# Patient Record
Sex: Male | Born: 1960 | Race: Black or African American | Hispanic: No | Marital: Married | Smoking: Never smoker
Health system: Southern US, Community
[De-identification: ages and names within clinical notes are randomized; demographics above are authoritative.]

## PROBLEM LIST (undated history)

## (undated) DIAGNOSIS — R079 Chest pain, unspecified: Secondary | ICD-10-CM

## (undated) DIAGNOSIS — I10 Essential (primary) hypertension: Secondary | ICD-10-CM

## (undated) DIAGNOSIS — E119 Type 2 diabetes mellitus without complications: Secondary | ICD-10-CM

## (undated) HISTORY — DX: Chest pain, unspecified: R07.9

## (undated) HISTORY — PX: HAND SURGERY: SHX662

## (undated) HISTORY — DX: Essential (primary) hypertension: I10

## (undated) HISTORY — DX: Type 2 diabetes mellitus without complications: E11.9

## (undated) HISTORY — PX: COLONOSCOPY: SHX174

---

## 1999-05-19 ENCOUNTER — Encounter: Payer: Self-pay | Admitting: Emergency Medicine

## 1999-05-19 ENCOUNTER — Emergency Department (HOSPITAL_COMMUNITY): Admission: EM | Admit: 1999-05-19 | Discharge: 1999-05-19 | Payer: Self-pay | Admitting: *Deleted

## 1999-05-27 ENCOUNTER — Encounter: Payer: Self-pay | Admitting: Family Medicine

## 1999-05-27 ENCOUNTER — Ambulatory Visit (HOSPITAL_COMMUNITY): Admission: RE | Admit: 1999-05-27 | Discharge: 1999-05-27 | Payer: Self-pay | Admitting: Family Medicine

## 2001-02-28 ENCOUNTER — Emergency Department (HOSPITAL_COMMUNITY): Admission: EM | Admit: 2001-02-28 | Discharge: 2001-02-28 | Payer: Self-pay | Admitting: Emergency Medicine

## 2001-02-28 ENCOUNTER — Encounter: Payer: Self-pay | Admitting: Emergency Medicine

## 2008-10-05 ENCOUNTER — Ambulatory Visit: Payer: Self-pay | Admitting: Family Medicine

## 2008-10-05 DIAGNOSIS — J309 Allergic rhinitis, unspecified: Secondary | ICD-10-CM | POA: Insufficient documentation

## 2008-10-05 DIAGNOSIS — G43909 Migraine, unspecified, not intractable, without status migrainosus: Secondary | ICD-10-CM | POA: Insufficient documentation

## 2008-10-11 ENCOUNTER — Ambulatory Visit: Payer: Self-pay | Admitting: Cardiology

## 2008-10-11 ENCOUNTER — Telehealth (INDEPENDENT_AMBULATORY_CARE_PROVIDER_SITE_OTHER): Payer: Self-pay | Admitting: *Deleted

## 2008-11-02 ENCOUNTER — Ambulatory Visit: Payer: Self-pay | Admitting: Family Medicine

## 2008-11-03 ENCOUNTER — Encounter (INDEPENDENT_AMBULATORY_CARE_PROVIDER_SITE_OTHER): Payer: Self-pay | Admitting: *Deleted

## 2008-11-03 LAB — CONVERTED CEMR LAB
ALT: 40 units/L (ref 0–53)
AST: 24 units/L (ref 0–37)
Albumin: 4.2 g/dL (ref 3.5–5.2)
Alkaline Phosphatase: 66 units/L (ref 39–117)
BUN: 15 mg/dL (ref 6–23)
Basophils Absolute: 0.1 10*3/uL (ref 0.0–0.1)
Basophils Relative: 0.8 % (ref 0.0–3.0)
Bilirubin, Direct: 0 mg/dL (ref 0.0–0.3)
CO2: 28 meq/L (ref 19–32)
Calcium: 9.3 mg/dL (ref 8.4–10.5)
Chloride: 107 meq/L (ref 96–112)
Cholesterol: 143 mg/dL (ref 0–200)
Creatinine, Ser: 1.1 mg/dL (ref 0.4–1.5)
Eosinophils Absolute: 0.2 10*3/uL (ref 0.0–0.7)
Eosinophils Relative: 2.1 % (ref 0.0–5.0)
GFR calc non Af Amer: 92.87 mL/min (ref >60)
Glucose, Bld: 99 mg/dL (ref 70–99)
HCT: 47.6 % (ref 39.0–52.0)
HDL: 61 mg/dL (ref >39.00)
Hemoglobin: 16.1 g/dL (ref 13.0–17.0)
LDL Cholesterol: 56 mg/dL (ref 0–99)
Lymphocytes Relative: 33.3 % (ref 12.0–46.0)
Lymphs Abs: 2.5 10*3/uL (ref 0.7–4.0)
MCHC: 33.8 g/dL (ref 30.0–36.0)
MCV: 81.1 fL (ref 78.0–100.0)
Monocytes Absolute: 0.7 10*3/uL (ref 0.1–1.0)
Monocytes Relative: 9.7 % (ref 3.0–12.0)
Neutro Abs: 3.9 10*3/uL (ref 1.4–7.7)
Neutrophils Relative %: 54.1 % (ref 43.0–77.0)
Platelets: 231 10*3/uL (ref 150.0–400.0)
Potassium: 4 meq/L (ref 3.5–5.1)
RBC: 5.86 M/uL — ABNORMAL HIGH (ref 4.22–5.81)
RDW: 13.1 % (ref 11.5–14.6)
Sodium: 141 meq/L (ref 135–145)
TSH: 1.31 microintl units/mL (ref 0.35–5.50)
Total Bilirubin: 1.3 mg/dL — ABNORMAL HIGH (ref 0.3–1.2)
Total CHOL/HDL Ratio: 2
Total Protein: 7.4 g/dL (ref 6.0–8.3)
Triglycerides: 132 mg/dL (ref 0.0–149.0)
VLDL: 26.4 mg/dL (ref 0.0–40.0)
WBC: 7.4 10*3/uL (ref 4.5–10.5)

## 2009-08-10 ENCOUNTER — Encounter: Admission: RE | Admit: 2009-08-10 | Discharge: 2009-08-10 | Payer: Self-pay | Admitting: *Deleted

## 2009-09-17 ENCOUNTER — Encounter (INDEPENDENT_AMBULATORY_CARE_PROVIDER_SITE_OTHER): Payer: Self-pay | Admitting: *Deleted

## 2009-09-17 ENCOUNTER — Ambulatory Visit: Payer: Self-pay | Admitting: Internal Medicine

## 2010-04-03 ENCOUNTER — Telehealth: Payer: Self-pay | Admitting: Family Medicine

## 2010-04-12 ENCOUNTER — Ambulatory Visit: Payer: Self-pay | Admitting: Family Medicine

## 2010-04-12 DIAGNOSIS — I10 Essential (primary) hypertension: Secondary | ICD-10-CM | POA: Insufficient documentation

## 2010-04-12 LAB — CONVERTED CEMR LAB
ALT: 28 units/L (ref 0–53)
AST: 19 units/L (ref 0–37)
Albumin: 4.4 g/dL (ref 3.5–5.2)
Alkaline Phosphatase: 82 units/L (ref 39–117)
BUN: 14 mg/dL (ref 6–23)
Basophils Absolute: 0 10*3/uL (ref 0.0–0.1)
Basophils Relative: 0.4 % (ref 0.0–3.0)
Bilirubin, Direct: 0.3 mg/dL (ref 0.0–0.3)
CO2: 30 meq/L (ref 19–32)
Calcium: 9.2 mg/dL (ref 8.4–10.5)
Chloride: 103 meq/L (ref 96–112)
Cholesterol: 156 mg/dL (ref 0–200)
Creatinine, Ser: 1 mg/dL (ref 0.4–1.5)
Eosinophils Absolute: 0.2 10*3/uL (ref 0.0–0.7)
Eosinophils Relative: 1.7 % (ref 0.0–5.0)
GFR calc non Af Amer: 100.68 mL/min (ref >60)
Glucose, Bld: 92 mg/dL (ref 70–99)
HCT: 46.5 % (ref 39.0–52.0)
HDL: 70.8 mg/dL (ref >39.00)
Hemoglobin: 15.6 g/dL (ref 13.0–17.0)
LDL Cholesterol: 55 mg/dL (ref 0–99)
Lymphocytes Relative: 29.6 % (ref 12.0–46.0)
Lymphs Abs: 2.6 10*3/uL (ref 0.7–4.0)
MCHC: 33.6 g/dL (ref 30.0–36.0)
MCV: 80.3 fL (ref 78.0–100.0)
Monocytes Absolute: 0.8 10*3/uL (ref 0.1–1.0)
Monocytes Relative: 8.5 % (ref 3.0–12.0)
Neutro Abs: 5.4 10*3/uL (ref 1.4–7.7)
Neutrophils Relative %: 59.8 % (ref 43.0–77.0)
PSA: 0.42 ng/mL (ref 0.10–4.00)
Platelets: 260 10*3/uL (ref 150.0–400.0)
Potassium: 3.9 meq/L (ref 3.5–5.1)
RBC: 5.79 M/uL (ref 4.22–5.81)
RDW: 13.8 % (ref 11.5–14.6)
Sodium: 140 meq/L (ref 135–145)
TSH: 1.43 microintl units/mL (ref 0.35–5.50)
Total Bilirubin: 1.8 mg/dL — ABNORMAL HIGH (ref 0.3–1.2)
Total CHOL/HDL Ratio: 2
Total Protein: 7.6 g/dL (ref 6.0–8.3)
Triglycerides: 152 mg/dL — ABNORMAL HIGH (ref 0.0–149.0)
VLDL: 30.4 mg/dL (ref 0.0–40.0)
WBC: 9 10*3/uL (ref 4.5–10.5)

## 2010-05-15 ENCOUNTER — Encounter (INDEPENDENT_AMBULATORY_CARE_PROVIDER_SITE_OTHER): Payer: Self-pay | Admitting: *Deleted

## 2010-07-22 ENCOUNTER — Ambulatory Visit
Admission: RE | Admit: 2010-07-22 | Discharge: 2010-07-22 | Payer: Self-pay | Source: Home / Self Care | Attending: Family Medicine | Admitting: Family Medicine

## 2010-07-22 ENCOUNTER — Encounter: Payer: Self-pay | Admitting: Family Medicine

## 2010-07-22 ENCOUNTER — Other Ambulatory Visit: Payer: Self-pay | Admitting: Family Medicine

## 2010-07-22 LAB — HEPATIC FUNCTION PANEL
ALT: 27 U/L (ref 0–53)
AST: 20 U/L (ref 0–37)
Albumin: 4.2 g/dL (ref 3.5–5.2)
Alkaline Phosphatase: 71 U/L (ref 39–117)
Bilirubin, Direct: 0.2 mg/dL (ref 0.0–0.3)
Total Bilirubin: 1.3 mg/dL — ABNORMAL HIGH (ref 0.3–1.2)
Total Protein: 7.4 g/dL (ref 6.0–8.3)

## 2010-07-22 LAB — LIPID PANEL
Cholesterol: 153 mg/dL (ref 0–200)
HDL: 70.9 mg/dL (ref >39.00)
LDL Cholesterol: 62 mg/dL (ref 0–99)
Total CHOL/HDL Ratio: 2
Triglycerides: 103 mg/dL (ref 0.0–149.0)
VLDL: 20.6 mg/dL (ref 0.0–40.0)

## 2010-07-22 LAB — BASIC METABOLIC PANEL
BUN: 18 mg/dL (ref 6–23)
CO2: 28 mEq/L (ref 19–32)
Calcium: 9.4 mg/dL (ref 8.4–10.5)
Chloride: 107 mEq/L (ref 96–112)
Creatinine, Ser: 1.2 mg/dL (ref 0.4–1.5)
GFR: 87.56 mL/min (ref >60.00)
Glucose, Bld: 103 mg/dL — ABNORMAL HIGH (ref 70–99)
Potassium: 3.9 mEq/L (ref 3.5–5.1)
Sodium: 143 mEq/L (ref 135–145)

## 2010-07-22 LAB — CBC WITH DIFFERENTIAL/PLATELET
Basophils Absolute: 0 10*3/uL (ref 0.0–0.1)
Basophils Relative: 0.6 % (ref 0.0–3.0)
Eosinophils Absolute: 0.2 10*3/uL (ref 0.0–0.7)
Eosinophils Relative: 1.9 % (ref 0.0–5.0)
HCT: 47.3 % (ref 39.0–52.0)
Hemoglobin: 15.7 g/dL (ref 13.0–17.0)
Lymphocytes Relative: 36.1 % (ref 12.0–46.0)
Lymphs Abs: 3.1 10*3/uL (ref 0.7–4.0)
MCHC: 33.1 g/dL (ref 30.0–36.0)
MCV: 81 fl (ref 78.0–100.0)
Monocytes Absolute: 0.8 10*3/uL (ref 0.1–1.0)
Monocytes Relative: 9.8 % (ref 3.0–12.0)
Neutro Abs: 4.5 10*3/uL (ref 1.4–7.7)
Neutrophils Relative %: 51.6 % (ref 43.0–77.0)
Platelets: 234 10*3/uL (ref 150.0–400.0)
RBC: 5.84 Mil/uL — ABNORMAL HIGH (ref 4.22–5.81)
RDW: 14.1 % (ref 11.5–14.6)
WBC: 8.7 10*3/uL (ref 4.5–10.5)

## 2010-07-22 LAB — TSH: TSH: 1.83 u[IU]/mL (ref 0.35–5.50)

## 2010-07-22 LAB — PSA: PSA: 0.5 ng/mL (ref 0.10–4.00)

## 2010-08-15 NOTE — Assessment & Plan Note (Signed)
Summary: cpx/fasting/new insurance/kn   Vital Signs:  Patient profile:   50 year old male Height:      67.50 inches Weight:      203 pounds BMI:     31.44 Pulse rate:   76 / minute BP sitting:   120 / 82  (left arm)  Vitals Entered By: Doristine Devoid CMA (July 22, 2010 8:06 AM) CC: CPX AND LABS    History of Present Illness: 50 yo man here today for CPE.  no concerns today.  Preventive Screening-Counseling & Management  Alcohol-Tobacco     Alcohol drinks/day: 0     Smoking Status: never  Caffeine-Diet-Exercise     Does Patient Exercise: yes     Type of exercise: walking     Exercise (avg: min/session): 30-60     Times/week: 5      Sexual History:  currently monogamous.        Drug Use:  never.    Current Medications (verified): 1)  Sumatriptan Succinate 50 Mg Tabs (Sumatriptan Succinate) .Marland Kitchen.. 1 Tab By Mouth As Needed For Headache.  May Repeat Dose in 2 Hours If Sxs Persist 2)  Fluticasone Propionate 50 Mcg/act  Susp (Fluticasone Propionate) .... 2 Sprays Each Nostril Once Daily 3)  Tylenol Pm Extra Strength 500-25 Mg Tabs (Diphenhydramine-Apap (Sleep)) .... 2 By Mouth Qhs 4)  Vicks Nyquil Cough 6.25-15 Mg/41ml Liqd (Doxylamine-Dm) .... By Mouth At Bedtime Prm 5)  Excedrin Migraine 250-250-65 Mg Tabs (Aspirin-Acetaminophen-Caffeine) .... 2 By Mouth Once Daily 6)  Claritin-D 24 Hour 10-240 Mg Xr24h-Tab (Loratadine-Pseudoephedrine) .Marland Kitchen.. 1 By Mouth Once Daily As Needed 7)  Aleve 220 Mg Tabs (Naproxen Sodium) .... 2 By Mouth Qd  Allergies (verified): No Known Drug Allergies  Past History:  Past medical, surgical, family and social histories (including risk factors) reviewed, and no changes noted (except as noted below).  Past Medical History: Reviewed history from 11/02/2008 and no changes required. Migraines Seasonal allergies  Past Surgical History: Reviewed history from 11/02/2008 and no changes required. none  Family History: Reviewed history from  10/05/2008 and no changes required. CAD-no HTN-father DM-no Steward Drone COLON CA-no PROSTATE CA-no  Social History: Reviewed history from 10/05/2008 and no changes required. married, coaches football for [employer] Patient Exercise:  yes  Review of Systems  The patient denies anorexia, fever, weight loss, weight gain, vision loss, decreased hearing, hoarseness, chest pain, syncope, dyspnea on exertion, peripheral edema, prolonged cough, headaches, abdominal pain, melena, hematochezia, severe indigestion/heartburn, hematuria, suspicious skin lesions, depression, abnormal bleeding, enlarged lymph nodes, and testicular masses.    Physical Exam  General:  Well-developed,well-nourished,in no acute distress; alert,appropriate and cooperative throughout examination Head:  Normocephalic and atraumatic without obvious abnormalities. Eyes:  No corneal or conjunctival inflammation noted. EOMI. Perrla. Funduscopic exam benign, without hemorrhages, exudates or papilledema. Vision grossly normal. Ears:  External ear exam shows no significant lesions or deformities.  Otoscopic examination reveals clear canals, tympanic membranes are intact bilaterally without bulging, retraction, inflammation or discharge. Hearing is grossly normal bilaterally. Nose:  edematous turbinates bilaterally Mouth:  Oral mucosa and oropharynx without lesions or exudates.  Teeth in good repair.  + PND Neck:  No deformities, masses, or tenderness noted. Lungs:  Normal respiratory effort, chest expands symmetrically. Lungs are clear to auscultation, no crackles or wheezes. Heart:  Normal rate and regular rhythm. S1 and S2 normal without gallop, murmur, click, rub or other extra sounds. Abdomen:  Bowel sounds positive,abdomen soft and non-tender without masses, organomegaly or hernias noted. Rectal:  No external abnormalities noted. Normal sphincter tone. No rectal masses or tenderness. Genitalia:  Testes bilaterally descended  without nodularity, tenderness or masses. No scrotal masses or lesions. No penis lesions or urethral discharge. Prostate:  Prostate gland firm and smooth, no enlargement, nodularity, tenderness, mass, asymmetry or induration. Pulses:  +2 carotid, radial, DP Extremities:  no C/C/E, full ROM Neurologic:  No cranial nerve deficits noted. Station and gait are normal. Plantar reflexes are down-going bilaterally. DTRs are symmetrical throughout. Sensory, motor and coordinative functions appear intact. Skin:  Intact without suspicious lesions or rashes Cervical Nodes:  No lymphadenopathy noted Inguinal Nodes:  No significant adenopathy Psych:  Cognition and judgment appear intact. Alert and cooperative with normal attention span and concentration. No apparent delusions, illusions, hallucinations   Impression & Recommendations:  Problem # 1:  HEALTHY ADULT MALE (ICD-V70.0) Assessment Unchanged pt's PE WNL.  check labs.  anticipatory guidance provided. Orders: Venipuncture (04540) TLB-Lipid Panel (80061-LIPID) TLB-BMP (Basic Metabolic Panel-BMET) (80048-METABOL) TLB-CBC Platelet - w/Differential (85025-CBCD) TLB-Hepatic/Liver Function Pnl (80076-HEPATIC) TLB-TSH (Thyroid Stimulating Hormone) (84443-TSH) TLB-PSA (Prostate Specific Antigen) (84153-PSA)  Complete Medication List: 1)  Sumatriptan Succinate 50 Mg Tabs (Sumatriptan succinate) .Marland Kitchen.. 1 tab by mouth as needed for headache.  may repeat dose in 2 hours if sxs persist 2)  Fluticasone Propionate 50 Mcg/act Susp (Fluticasone propionate) .... 2 sprays each nostril once daily 3)  Tylenol Pm Extra Strength 500-25 Mg Tabs (Diphenhydramine-apap (sleep)) .... 2 by mouth qhs 4)  Vicks Nyquil Cough 6.25-15 Mg/26ml Liqd (Doxylamine-dm) .... By mouth at bedtime prm 5)  Excedrin Migraine 250-250-65 Mg Tabs (Aspirin-acetaminophen-caffeine) .... 2 by mouth once daily 6)  Claritin-d 24 Hour 10-240 Mg Xr24h-tab (Loratadine-pseudoephedrine) .Marland Kitchen.. 1 by mouth  once daily as needed 7)  Aleve 220 Mg Tabs (Naproxen sodium) .... 2 by mouth qd  Patient Instructions: 1)  Follow up in 1 year or as needed 2)  We'll notify you of your lab results 3)  Call with any questions or concerns 4)  Happy New Year!   Orders Added: 1)  Venipuncture [36415] 2)  TLB-Lipid Panel [80061-LIPID] 3)  TLB-BMP (Basic Metabolic Panel-BMET) [80048-METABOL] 4)  TLB-CBC Platelet - w/Differential [85025-CBCD] 5)  TLB-Hepatic/Liver Function Pnl [80076-HEPATIC] 6)  TLB-TSH (Thyroid Stimulating Hormone) [84443-TSH] 7)  TLB-PSA (Prostate Specific Antigen) [84153-PSA] 8)  Est. Patient 40-64 years [98119]
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
## 2010-08-15 NOTE — Assessment & Plan Note (Signed)
Summary: NEW TO ESTABLISH/CPX/PH   Vital Signs:  Patient profile:   50 year old male Height:      67.50 inches Weight:      198.2 pounds Pulse rate:   78 / minute Resp:     16 per minute BP sitting:   130 / 82  (left arm)  Vitals Entered By: Doristine Devoid (October 05, 2008 10:14 AM) CC: new est- HA x3-4 months some nause and vomiting at onset last HA 3month ago hurt to open eyes   History of Present Illness: 50 yo man new to office today to establish care.  can't remember previous MD- 'has been so long'.  pt reports he has always had headaches but he's been too busy to deal w/ them.  takes a few excedrin and 'keeps going'.  1 month ago pt had HA so severe it hurt to open his eyes.  + nausea, vomiting x3.  + photo and phonophobia.  pt remembers aura prior to HA.  sister has migraines.  pt now getting HAs at least 2x/week.  excedrin no longer effective.  HAs not currently as severe.  HAs typically occuring first thing in AM, prior to getting out of bed.  HAs are frontal.  pain described as 'pounding my head w/ a hammer'.  no focal weakness w/ current HAs but bad HA resulted in L leg numbness- resolved.  no fevers.  when pt had severe HA had bladder incontinence x1- it was b/c i didn't want to move.  none since.  HAs have been getting progressively for last 3 months.  pt doesn't recall any life changes 3 months ago- no increased stress, new environments.  Current Medications (verified): 1)  Propranolol Hcl 40 Mg  Tabs (Propranolol Hcl) .Marland Kitchen.. 1 Tab By Mouth Two Times A Day For Migraine Prevention 2)  Sumatriptan Succinate 50 Mg Tabs (Sumatriptan Succinate) .Marland Kitchen.. 1 Tab By Mouth As Needed For Headache.  May Repeat Dose in 2 Hours If Sxs Persist 3)  Fluticasone Propionate 50 Mcg/act  Susp (Fluticasone Propionate) .... 2 Sprays Each Nostril Once Daily  Allergies (verified): No Known Drug Allergies  Family History:    CAD-no    HTN-father    DM-no    STROKE-no    COLON CA-no    PROSTATE  CA-no  Social History:    married, coaches football for [employer]  Review of Systems General:  Denies chills, fatigue, and fever. Eyes:  Complains of blurring and double vision; during HAs. CV:  Denies chest pain or discomfort, near fainting, and palpitations. Resp:  Denies shortness of breath. GI:  Complains of nausea and vomiting; during HAs. Neuro:  Complains of headaches, numbness, and visual disturbances.  Physical Exam  General:  Well-developed,well-nourished,in no acute distress; alert,appropriate and cooperative throughout examination Head:  Normocephalic and atraumatic without obvious abnormalities. No apparent alopecia or balding. Eyes:  No corneal or conjunctival inflammation noted. EOMI. Perrla. Funduscopic exam benign, without hemorrhages, exudates or papilledema. Vision grossly normal. Ears:  External ear exam shows no significant lesions or deformities.  Otoscopic examination reveals clear canals, tympanic membranes are intact bilaterally without bulging, retraction, inflammation or discharge. Hearing is grossly normal bilaterally. Nose:  edematous turbinates bilaterally Mouth:  Oral mucosa and oropharynx without lesions or exudates.  Teeth in good repair. Neck:  No deformities, masses, or tenderness noted. Lungs:  Normal respiratory effort, chest expands symmetrically. Lungs are clear to auscultation, no crackles or wheezes. Heart:  Normal rate and regular rhythm. S1 and S2  normal without gallop, murmur, click, rub or other extra sounds. Pulses:  +2 carotid, radial, DP Extremities:  no C/C/E, full ROM Neurologic:  No cranial nerve deficits noted. Station and gait are normal. Plantar reflexes are down-going bilaterally. DTRs are symmetrical throughout. Sensory, motor and coordinative functions appear intact.   Impression & Recommendations:  Problem # 1:  MIGRAINE (ICD-346.90) Assessment New pt w/ hx of HAs but recently more severe.  HA consistent w/ typical migraine w/  aura.  HAs now occurring  ~2x/week.  will start migraine prophylaxis w/ beta blocker and give script for abortive tx.  will get head CT to r/o intracranial process.  if no improvement will need to refer to HA wellness center.  reviewed red flags that should prompt return.  Pt expresses understanding and is in agreement w/ this plan. His updated medication list for this problem includes:    Propranolol Hcl 40 Mg Tabs (Propranolol hcl) .Marland Kitchen... 1 tab by mouth two times a day for migraine prevention    Sumatriptan Succinate 50 Mg Tabs (Sumatriptan succinate) .Marland Kitchen... 1 tab by mouth as needed for headache.  may repeat dose in 2 hours if sxs persist  Orders: Radiology Referral (Radiology)  Problem # 2:  RHINITIS (ICD-477.9) Assessment: New pt w/ edematous turbinates.  will start nasal steroid to see if HAs have allergic component. His updated medication list for this problem includes:    Fluticasone Propionate 50 Mcg/act Susp (Fluticasone propionate) .Marland Kitchen... 2 sprays each nostril once daily  Complete Medication List: 1)  Propranolol Hcl 40 Mg Tabs (Propranolol hcl) .Marland Kitchen.. 1 tab by mouth two times a day for migraine prevention 2)  Sumatriptan Succinate 50 Mg Tabs (Sumatriptan succinate) .Marland Kitchen.. 1 tab by mouth as needed for headache.  may repeat dose in 2 hours if sxs persist 3)  Fluticasone Propionate 50 Mcg/act Susp (Fluticasone propionate) .... 2 sprays each nostril once daily  Patient Instructions: 1)  Please schedule a follow-up appointment in 2-4 weeks for complete physical 2)  Take the propranalol two times a day as directed for headache prevention 3)  Take the sumatriptan if you develop severe headache 4)  Tylenol, Excedrin early in headache to prevent it from worsening- if no improvement, then take the sumatriptan 5)  Drink LOTS of water- this will also help prevent headache 6)  Use the nasal spray-2 sprays each nostril daily- to decrease congestion and possible headache trigger 7)  Someone will call  you with your CT appt 8)  Call with any questions or concerns- if your headaches again become unbearable, visual changes, or other concerns call or go to ER 9)  Hang in there! Prescriptions: FLUTICASONE PROPIONATE 50 MCG/ACT  SUSP (FLUTICASONE PROPIONATE) 2 sprays each nostril once daily  #1 x 3   Entered and Authorized by:   Neena Rhymes MD   Signed by:   Neena Rhymes MD on 10/05/2008   Method used:   Print then Give to Patient   RxID:   [id number] SUMATRIPTAN SUCCINATE 50 MG TABS (SUMATRIPTAN SUCCINATE) 1 tab by mouth as needed for headache.  may repeat dose in 2 hours if sxs persist  #1 month x 3   Entered and Authorized by:   Neena Rhymes MD   Signed by:   Neena Rhymes MD on 10/05/2008   Method used:   Print then Give to Patient   RxID:   [id number] PROPRANOLOL HCL 40 MG  TABS (PROPRANOLOL HCL) 1 tab by mouth two times a day for migraine prevention  #  60 x 3   Entered and Authorized by:   Neena Rhymes MD   Signed by:   Neena Rhymes MD on 10/05/2008   Method used:   Print then Give to Patient   RxID:   [id number]

## 2010-08-15 NOTE — Progress Notes (Signed)
Summary: ct results   Phone Note Outgoing Call   Call placed by: Doristine Devoid,  October 11, 2008 4:29 PM Call placed to: Patient Summary of Call: normal head CT- please call pt  Follow-up for Phone Call        left detailed msg on machine ......................Marland KitchenDoristine Devoid  October 11, 2008 4:30 PM

## 2010-08-15 NOTE — Letter (Signed)
Summary: Results Follow up Letter  [facility] [place]  [address] [place], [place] [address]   Phone: [phone]  Fax: [phone]    11/03/2008 MRN: [id number]  Lee Greene [address] [place], [place]  [address]  Dear [name],  The following are the results of your recent test(s):  Test         Result    Pap Smear:        Normal _____  Not Normal _____ Comments: ______________________________________________________ Cholesterol: LDL(Bad cholesterol):         Your goal is less than:         HDL (Good cholesterol):       Your goal is more than: Comments:  ______________________________________________________ Mammogram:        Normal _____  Not Normal _____ Comments:  ___________________________________________________________________ Hemoccult:        Normal _____  Not normal _______ Comments:    _____________________________________________________________________ Other Tests: PLEASE SEE COPY OF LABS FROM 11/02/08- labs look great!  follow up in 1 year     We routinely do not discuss normal results over the telephone.  If you desire a copy of the results, or you have any questions about this information we can discuss them at your next office visit.   Sincerely,

## 2010-08-15 NOTE — Letter (Signed)
Summary: Physical & Physician Assessment Form/[facility]  Physical & Physician Assessment Form/[facility]   Imported By: Lanelle Bal 07/30/2010 10:53:59  _____________________________________________________________________  External Attachment:    Type:   Image     Comment:   External Document

## 2010-08-15 NOTE — Letter (Signed)
Summary: [facility] No Show Letter  [facility] [place]  [address] [place], [place] [address]   Phone: [phone]  Fax: [phone]    05/15/2010 MRN: [id number]  Lee Greene [address] [place], [place]  [address]   Dear [name],   Our records indicate that you missed your scheduled appointment with ______Dr.Tabori____________ on _____11/1/11______.  Please contact this office to reschedule your appointment as soon as possible.  It is important that you keep your scheduled appointments with your physician, so we can provide you the best care possible.  Please be advised that there may be a charge for "no show" appointments.    Sincerely,   [place] [facility]

## 2010-08-15 NOTE — Assessment & Plan Note (Signed)
Summary: CPX--PH   Vital Signs:  Patient profile:   50 year old male Height:      67.50 inches Weight:      200.6 pounds BMI:     31.07 Pulse rate:   72 / minute Resp:     16 per minute BP sitting:   126 / 78  (left arm)  Vitals Entered By: Doristine Devoid (November 02, 2008 8:48 AM) CC: cpx and labs   History of Present Illness: 50 yo man here today for CPE.  pt w/out questions or concerns today.  reports HAs have dramatically improved since starting propranalol.  Preventive Screening-Counseling & Management     Alcohol drinks/day: 0     Smoking Status: never     Does Patient Exercise: no      Sexual History:  currently monogamous.        Drug Use:  never.    Medications Prior to Update: 1)  Propranolol Hcl 40 Mg  Tabs (Propranolol Hcl) .Marland Kitchen.. 1 Tab By Mouth Two Times A Day For Migraine Prevention 2)  Sumatriptan Succinate 50 Mg Tabs (Sumatriptan Succinate) .Marland Kitchen.. 1 Tab By Mouth As Needed For Headache.  May Repeat Dose in 2 Hours If Sxs Persist 3)  Fluticasone Propionate 50 Mcg/act  Susp (Fluticasone Propionate) .... 2 Sprays Each Nostril Once Daily  Current Medications (verified): 1)  Propranolol Hcl 40 Mg  Tabs (Propranolol Hcl) .Marland Kitchen.. 1 Tab By Mouth Two Times A Day For Migraine Prevention 2)  Sumatriptan Succinate 50 Mg Tabs (Sumatriptan Succinate) .Marland Kitchen.. 1 Tab By Mouth As Needed For Headache.  May Repeat Dose in 2 Hours If Sxs Persist 3)  Fluticasone Propionate 50 Mcg/act  Susp (Fluticasone Propionate) .... 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)  Past Medical History:    Migraines    Seasonal allergies  Past Surgical History:    none  Social History:    Smoking Status:  never    Does Patient Exercise:  no    Sexual History:   currently monogamous    Drug Use:  never  Review of Systems  The patient denies anorexia, fever, weight loss, weight gain, vision loss, decreased hearing, hoarseness, chest pain, syncope, dyspnea on exertion, peripheral edema, prolonged cough, headaches, abdominal pain, melena, hematochezia, hematuria, genital sores, suspicious skin lesions, depression, abnormal bleeding, enlarged lymph nodes, and testicular masses.    Physical Exam  General:  Well-developed,well-nourished,in no acute distress; alert,appropriate and cooperative throughout examination Head:  Normocephalic and atraumatic without obvious abnormalities. Eyes:  No corneal or conjunctival inflammation noted. EOMI. Perrla. Funduscopic exam benign, without hemorrhages, exudates or papilledema. Vision grossly normal. Ears:  External ear exam shows no significant lesions or deformities.  Otoscopic examination reveals clear canals, tympanic membranes are intact bilaterally without bulging, retraction, inflammation or discharge. Hearing is grossly normal bilaterally. Nose:  edematous turbinates bilaterally Mouth:  Oral mucosa and oropharynx without lesions or exudates.  Teeth in good repair.  + PND Neck:  No deformities, masses, or tenderness noted. Lungs:  Normal respiratory effort, chest expands symmetrically. Lungs are clear to auscultation, no crackles or wheezes. Heart:  Normal rate and regular rhythm. S1 and S2 normal without gallop, murmur, click, rub or  other extra sounds. Abdomen:  Bowel sounds positive,abdomen soft and non-tender without masses, organomegaly or hernias noted. Genitalia:  Testes bilaterally descended without nodularity, tenderness or masses. No scrotal masses or lesions. No penis lesions or urethral discharge. Msk:  No deformity or scoliosis noted of thoracic or lumbar spine.   Pulses:  +2 carotid, radial, DP Extremities:  no C/C/E, full ROM Neurologic:  No cranial nerve deficits noted. Station and gait are  normal. Plantar reflexes are down-going bilaterally. DTRs are symmetrical throughout. Sensory, motor and coordinative functions appear intact. Skin:  pt w/ hyperpigmented macule on finger pad of L 5th finger.  pt reports slamming it in window and subsequent blood blister. Cervical Nodes:  No lymphadenopathy noted Inguinal Nodes:  No significant adenopathy Psych:  Cognition and judgment appear intact. Alert and cooperative with normal attention span and concentration. No apparent delusions, illusions, hallucinations   Impression & Recommendations:  Problem # 1:  HEALTHY ADULT MALE (ICD-V70.0) Assessment New pt's PE WNL.  labs as below.  anticipatory guidance provided- stressed importance of diet and exercise.  EKG as baseline. Orders: Venipuncture ([address]) TLB-Lipid Panel (80061-LIPID) TLB-BMP (Basic Metabolic Panel-BMET) (80048-METABOL) TLB-CBC Platelet - w/Differential (85025-CBCD) TLB-Hepatic/Liver Function Pnl (80076-HEPATIC) TLB-TSH (Thyroid Stimulating Hormone) (84443-TSH) EKG w/ Interpretation ([address])  Complete Medication List: 1)  Propranolol Hcl 40 Mg Tabs (Propranolol hcl) .Marland Kitchen.. 1 tab by mouth two times a day for migraine prevention 2)  Sumatriptan Succinate 50 Mg Tabs (Sumatriptan succinate) .Marland Kitchen.. 1 tab by mouth as needed for headache.  may repeat dose in 2 hours if sxs persist 3)  Fluticasone Propionate 50 Mcg/act Susp (Fluticasone propionate) .... 2 sprays each nostril once daily  Patient Instructions: 1)  Please schedule a follow-up appointment in 1 year or as needed. 2)  Focus on healthy diet and regular exercise 3)  If your blood blister doesn't improve in the next few weeks- please call and we'll send you to dermatology 4)  We will notify you of your lab results 5)  Please call with any questions or concerns 6)  Happy Spring!

## 2010-08-15 NOTE — Letter (Signed)
Summary: [facility] No Show Letter  [facility] [place]  [address] [place], [place] [address]   Phone: [phone]  Fax: [phone]    09/17/2009 MRN: [id number]  Lee Greene [address] [place], [place]  [address]   Dear Mr. AYCOCK,   Our records indicate that you missed your scheduled appointment with Dr. Drue Novel on 09/17/09.  Please contact this office to reschedule your appointment as soon as possible.  It is important that you keep your scheduled appointments with your physician, so we can provide you the best care possible.  Please be advised that there may be a charge for "no show" appointments.    Sincerely,   [facility]

## 2010-08-15 NOTE — Progress Notes (Signed)
Summary: CPX documentation  Phone Note Call from Patient Call back at Home Phone [phone]   Summary of Call: Patient called for copy of last cpx and labs to be mailed to his home. I returned call to patient to let him know that this will be done and he is overdue for CPX. Left message on machine to call back to office. Lucious Groves CMA,  April 03, 2010 4:27 PM  Left message on machine to call back to office. Lucious Groves CMA  April 04, 2010 12:54 PM   Left message on machine to call back to office. Lucious Groves CMA  April 05, 2010 11:12 AM   Follow-up for Phone Call        No return call from patient. Closed phone note until patient contacts the office again. Lucious Groves CMA  April 09, 2010 9:30 AM

## 2011-04-23 ENCOUNTER — Ambulatory Visit (INDEPENDENT_AMBULATORY_CARE_PROVIDER_SITE_OTHER): Payer: BC Managed Care – PPO

## 2011-04-23 DIAGNOSIS — Z23 Encounter for immunization: Secondary | ICD-10-CM

## 2012-01-29 ENCOUNTER — Encounter: Payer: Self-pay | Admitting: Family Medicine

## 2012-01-29 ENCOUNTER — Ambulatory Visit (INDEPENDENT_AMBULATORY_CARE_PROVIDER_SITE_OTHER): Payer: BC Managed Care – PPO | Admitting: Family Medicine

## 2012-01-29 VITALS — BP 125/80 | HR 76 | Temp 98.3°F | Ht 68.0 in | Wt 207.0 lb

## 2012-01-29 DIAGNOSIS — Z0000 Encounter for general adult medical examination without abnormal findings: Secondary | ICD-10-CM

## 2012-01-29 LAB — BASIC METABOLIC PANEL WITH GFR
BUN: 12 mg/dL (ref 6–23)
CO2: 27 meq/L (ref 19–32)
Calcium: 9.1 mg/dL (ref 8.4–10.5)
Chloride: 106 meq/L (ref 96–112)
Creatinine, Ser: 1.2 mg/dL (ref 0.4–1.5)
GFR: 87 mL/min
Glucose, Bld: 114 mg/dL — ABNORMAL HIGH (ref 70–99)
Potassium: 3.9 meq/L (ref 3.5–5.1)
Sodium: 141 meq/L (ref 135–145)

## 2012-01-29 LAB — CBC WITH DIFFERENTIAL/PLATELET
Basophils Absolute: 0 10*3/uL (ref 0.0–0.1)
Basophils Relative: 0.4 % (ref 0.0–3.0)
Eosinophils Absolute: 0.1 10*3/uL (ref 0.0–0.7)
Eosinophils Relative: 1.9 % (ref 0.0–5.0)
HCT: 49.1 % (ref 39.0–52.0)
Hemoglobin: 15.7 g/dL (ref 13.0–17.0)
Lymphocytes Relative: 29.6 % (ref 12.0–46.0)
Lymphs Abs: 2.4 10*3/uL (ref 0.7–4.0)
MCHC: 32 g/dL (ref 30.0–36.0)
MCV: 81.1 fl (ref 78.0–100.0)
Monocytes Absolute: 0.6 10*3/uL (ref 0.1–1.0)
Monocytes Relative: 7.9 % (ref 3.0–12.0)
Neutro Abs: 4.8 10*3/uL (ref 1.4–7.7)
Neutrophils Relative %: 60.2 % (ref 43.0–77.0)
Platelets: 245 10*3/uL (ref 150.0–400.0)
RBC: 6.06 Mil/uL — ABNORMAL HIGH (ref 4.22–5.81)
RDW: 13.9 % (ref 11.5–14.6)
WBC: 8 10*3/uL (ref 4.5–10.5)

## 2012-01-29 LAB — LIPID PANEL
Cholesterol: 145 mg/dL (ref 0–200)
HDL: 72.5 mg/dL (ref >39.00)
LDL Cholesterol: 56 mg/dL (ref 0–99)
Total CHOL/HDL Ratio: 2
Triglycerides: 82 mg/dL (ref 0.0–149.0)
VLDL: 16.4 mg/dL (ref 0.0–40.0)

## 2012-01-29 LAB — HEPATIC FUNCTION PANEL
ALT: 28 U/L (ref 0–53)
AST: 21 U/L (ref 0–37)
Albumin: 4.1 g/dL (ref 3.5–5.2)
Alkaline Phosphatase: 68 U/L (ref 39–117)
Bilirubin, Direct: 0.1 mg/dL (ref 0.0–0.3)
Total Bilirubin: 0.9 mg/dL (ref 0.3–1.2)
Total Protein: 7.5 g/dL (ref 6.0–8.3)

## 2012-01-29 LAB — TSH: TSH: 1.34 u[IU]/mL (ref 0.35–5.50)

## 2012-01-29 LAB — PSA: PSA: 0.57 ng/mL (ref 0.10–4.00)

## 2012-01-29 NOTE — Assessment & Plan Note (Signed)
Pt's PE WNL.  Encouraged healthy diet and regular exercise due to being overweight.  Check labs.  Anticipatory guidance provided.

## 2012-01-29 NOTE — Patient Instructions (Addendum)
Follow up in 1 year or as needed You look good!  Keep it up! Focus on healthy diet and regular exercise We'll notify you of your lab results Call with any questions or concerns Have a great trip to [place]!

## 2012-01-29 NOTE — Progress Notes (Signed)
  Subjective:    Patient ID: Lee Greene, male    DOB: 14-Oct-1960, 51 y.o.   MRN: [id number]  HPI CPE- no concerns.  Too young for colonoscopy.   Review of Systems Patient reports no vision/hearing changes, anorexia, fever ,adenopathy, persistant/recurrent hoarseness, swallowing issues, chest pain, palpitations, edema, persistant/recurrent cough, hemoptysis, dyspnea (rest,exertional, paroxysmal nocturnal), gastrointestinal  bleeding (melena, rectal bleeding), abdominal pain, excessive heart burn, GU symptoms (dysuria, hematuria, voiding/incontinence issues) syncope, focal weakness, memory loss, numbness & tingling, skin/hair/nail changes, depression, anxiety, abnormal bruising/bleeding, musculoskeletal symptoms/signs.     Objective:   Physical Exam BP 125/80  Pulse 76  Temp 98.3 F (36.8 C) (Oral)  Ht 5\' 8"  (1.727 m)  Wt 207 lb (93.895 kg)  BMI 31.47 kg/m2  SpO2 97%  General Appearance:    Alert, cooperative, no distress, appears stated age  Head:    Normocephalic, without obvious abnormality, atraumatic  Eyes:    PERRL, conjunctiva/corneas clear, EOM's intact, fundi    benign, both eyes       Ears:    Normal TM's and external ear canals, both ears  Nose:   Nares normal, septum midline, mucosa normal, no drainage   or sinus tenderness  Throat:   Lips, mucosa, and tongue normal; teeth and gums normal  Neck:   Supple, symmetrical, trachea midline, no adenopathy;       thyroid:  No enlargement/tenderness/nodules  Back:     Symmetric, no curvature, ROM normal, no CVA tenderness  Lungs:     Clear to auscultation bilaterally, respirations unlabored  Chest wall:    No tenderness or deformity  Heart:    Regular rate and rhythm, S1 and S2 normal, no murmur, rub   or gallop  Abdomen:     Soft, non-tender, bowel sounds active all four quadrants,    no masses, no organomegaly  Genitalia:    Normal male without lesion, discharge or tenderness  Rectal:    Normal tone, normal prostate,  no masses or tenderness;   guaiac negative stool  Extremities:   Extremities normal, atraumatic, no cyanosis or edema  Pulses:   2+ and symmetric all extremities  Skin:   Skin color, texture, turgor normal, no rashes or lesions  Lymph nodes:   Cervical, supraclavicular, and axillary nodes normal  Neurologic:   CNII-XII intact. Normal strength, sensation and reflexes      throughout          Assessment & Plan:

## 2012-01-30 LAB — HEMOGLOBIN A1C: Hgb A1c MFr Bld: 6.7 % — ABNORMAL HIGH (ref 4.6–6.5)

## 2012-02-12 ENCOUNTER — Telehealth: Payer: Self-pay | Admitting: Family Medicine

## 2012-02-12 ENCOUNTER — Ambulatory Visit (INDEPENDENT_AMBULATORY_CARE_PROVIDER_SITE_OTHER)
Admission: RE | Admit: 2012-02-12 | Discharge: 2012-02-12 | Disposition: A | Discharge status: Home / Self Care | Payer: BC Managed Care – PPO | Source: Ambulatory Visit | Attending: Family Medicine | Admitting: Family Medicine

## 2012-02-12 ENCOUNTER — Ambulatory Visit (INDEPENDENT_AMBULATORY_CARE_PROVIDER_SITE_OTHER): Payer: BC Managed Care – PPO | Admitting: Family Medicine

## 2012-02-12 ENCOUNTER — Encounter: Payer: Self-pay | Admitting: Family Medicine

## 2012-02-12 VITALS — BP 130/80 | HR 86 | Temp 99.2°F | Wt 203.4 lb

## 2012-02-12 DIAGNOSIS — R062 Wheezing: Secondary | ICD-10-CM

## 2012-02-12 DIAGNOSIS — J40 Bronchitis, not specified as acute or chronic: Secondary | ICD-10-CM

## 2012-02-12 MED ORDER — ALBUTEROL SULFATE (5 MG/ML) 0.5% IN NEBU
2.5000 mg | INHALATION_SOLUTION | Freq: Once | RESPIRATORY_TRACT | Status: AC
  Administered 2012-02-12: 2.5 mg via RESPIRATORY_TRACT

## 2012-02-12 MED ORDER — MOXIFLOXACIN HCL 400 MG PO TABS: 400.0000 mg | ORAL_TABLET | Freq: Every day | ORAL | Status: AC

## 2012-02-12 MED ORDER — METHYLPREDNISOLONE ACETATE 80 MG/ML IJ SUSP
80.0000 mg | Freq: Once | INTRAMUSCULAR | Status: AC
  Administered 2012-02-12: 80 mg via INTRAMUSCULAR

## 2012-02-12 MED ORDER — PREDNISONE 10 MG PO TABS: ORAL_TABLET | ORAL | Status: DC

## 2012-02-12 NOTE — Telephone Encounter (Signed)
Caller: [place]/Patient; PCP: Sheliah Hatch.; CB#: [phone]. Call regarding Caller Would Like an Appt; Caller reports beginning Monday 7/29 he developed chills, N/V, headaches, and some chest tightness, rattling in chest. Coughing, wheezing. Sxs began while they were on vacation/after flying. See in 24 hrs per Cough-Adult Protocol for sharp fleeting chest pain with cough. Appt scheduled for today, Thurs 8/1 at 11:00  with Dr Laury Axon. Caller is agreeable.

## 2012-02-12 NOTE — Progress Notes (Signed)
  Subjective:     Lee Greene is a 51 y.o. male here for evaluation of a cough. Onset of symptoms was 3 days ago. Symptoms have been gradually worsening since that time. The cough is barky and productive and is aggravated by infection. Associated symptoms include: postnasal drip, shortness of breath, sputum production and wheezing. Patient does not have a history of asthma. Patient does not have a history of environmental allergens. Patient has not traveled recently. Patient does not have a history of smoking. Patient has not had a previous chest x-ray. Patient has not had a PPD done.  The following portions of the patient's history were reviewed and updated as appropriate: allergies, current medications, past family history, past medical history, past social history, past surgical history and problem list.  Review of Systems Pertinent items are noted in HPI.    Objective:    Oxygen saturation 97% on room air BP 130/80  Pulse 86  Temp 99.2 F (37.3 C) (Oral)  Wt 203 lb 6.4 oz (92.262 kg)  SpO2 97% General appearance: alert, cooperative and mild distress Ears: normal TM's and external ear canals both ears Nose: Nares normal. Septum midline. Mucosa normal. No drainage or sinus tenderness. Throat: lips, mucosa, and tongue normal; teeth and gums normal Neck: no adenopathy, no carotid bruit, no JVD, supple, symmetrical, trachea midline and thyroid not enlarged, symmetric, no tenderness/mass/nodules Lungs: wheezes bilaterally Heart: regular rate and rhythm    Assessment:    Acute Bronchitis and r/o pneumonia    Plan:    Antibiotics per medication orders. Avoid exposure to tobacco smoke and fumes. B-agonist inhaler. Call if shortness of breath worsens, blood in sputum, change in character of cough, development of fever or chills, inability to maintain nutrition and hydration. Avoid exposure to tobacco smoke and fumes. Chest x-ray.

## 2012-02-12 NOTE — Patient Instructions (Signed)

## 2012-02-13 ENCOUNTER — Encounter: Payer: Self-pay | Admitting: *Deleted

## 2012-07-19 LAB — HM DIABETES EYE EXAM

## 2013-02-03 ENCOUNTER — Ambulatory Visit (INDEPENDENT_AMBULATORY_CARE_PROVIDER_SITE_OTHER): Payer: BC Managed Care – PPO | Admitting: Family Medicine

## 2013-02-03 ENCOUNTER — Encounter: Payer: Self-pay | Admitting: Family Medicine

## 2013-02-03 VITALS — BP 134/88 | HR 90 | Temp 98.4°F | Ht 67.5 in | Wt 207.6 lb

## 2013-02-03 DIAGNOSIS — E119 Type 2 diabetes mellitus without complications: Secondary | ICD-10-CM

## 2013-02-03 DIAGNOSIS — Z0000 Encounter for general adult medical examination without abnormal findings: Secondary | ICD-10-CM

## 2013-02-03 DIAGNOSIS — N529 Male erectile dysfunction, unspecified: Secondary | ICD-10-CM | POA: Insufficient documentation

## 2013-02-03 LAB — CBC WITH DIFFERENTIAL/PLATELET
Basophils Absolute: 0.1 10*3/uL (ref 0.0–0.1)
Basophils Relative: 0.6 % (ref 0.0–3.0)
Eosinophils Absolute: 0.2 10*3/uL (ref 0.0–0.7)
Eosinophils Relative: 1.8 % (ref 0.0–5.0)
HCT: 48.5 % (ref 39.0–52.0)
Hemoglobin: 16 g/dL (ref 13.0–17.0)
Lymphocytes Relative: 31.4 % (ref 12.0–46.0)
Lymphs Abs: 2.7 10*3/uL (ref 0.7–4.0)
MCHC: 33.1 g/dL (ref 30.0–36.0)
MCV: 79.7 fl (ref 78.0–100.0)
Monocytes Absolute: 0.6 10*3/uL (ref 0.1–1.0)
Monocytes Relative: 6.9 % (ref 3.0–12.0)
Neutro Abs: 5 10*3/uL (ref 1.4–7.7)
Neutrophils Relative %: 59.3 % (ref 43.0–77.0)
Platelets: 256 10*3/uL (ref 150.0–400.0)
RBC: 6.09 Mil/uL — ABNORMAL HIGH (ref 4.22–5.81)
RDW: 14.2 % (ref 11.5–14.6)
WBC: 8.5 10*3/uL (ref 4.5–10.5)

## 2013-02-03 LAB — MICROALBUMIN / CREATININE URINE RATIO
Creatinine,U: 264.7 mg/dL
Microalb Creat Ratio: 0.6 mg/g (ref 0.0–30.0)
Microalb, Ur: 1.5 mg/dL (ref 0.0–1.9)

## 2013-02-03 LAB — HEPATIC FUNCTION PANEL
ALT: 28 U/L (ref 0–53)
AST: 21 U/L (ref 0–37)
Albumin: 4.2 g/dL (ref 3.5–5.2)
Alkaline Phosphatase: 73 U/L (ref 39–117)
Bilirubin, Direct: 0.2 mg/dL (ref 0.0–0.3)
Total Bilirubin: 1.1 mg/dL (ref 0.3–1.2)
Total Protein: 7.3 g/dL (ref 6.0–8.3)

## 2013-02-03 LAB — LIPID PANEL
Cholesterol: 152 mg/dL (ref 0–200)
HDL: 66.9 mg/dL (ref >39.00)
LDL Cholesterol: 61 mg/dL (ref 0–99)
Total CHOL/HDL Ratio: 2
Triglycerides: 121 mg/dL (ref 0.0–149.0)
VLDL: 24.2 mg/dL (ref 0.0–40.0)

## 2013-02-03 LAB — PSA: PSA: 0.55 ng/mL (ref 0.10–4.00)

## 2013-02-03 LAB — BASIC METABOLIC PANEL
BUN: 15 mg/dL (ref 6–23)
CO2: 27 mEq/L (ref 19–32)
Calcium: 9.4 mg/dL (ref 8.4–10.5)
Chloride: 108 mEq/L (ref 96–112)
Creatinine, Ser: 1.2 mg/dL (ref 0.4–1.5)
GFR: 85.78 mL/min (ref >60.00)
Glucose, Bld: 110 mg/dL — ABNORMAL HIGH (ref 70–99)
Potassium: 3.8 mEq/L (ref 3.5–5.1)
Sodium: 141 mEq/L (ref 135–145)

## 2013-02-03 LAB — HM DIABETES FOOT EXAM: HM Diabetic Foot Exam: NORMAL

## 2013-02-03 LAB — TSH: TSH: 1.74 u[IU]/mL (ref 0.35–5.50)

## 2013-02-03 LAB — HEMOGLOBIN A1C: Hgb A1c MFr Bld: 6.8 % — ABNORMAL HIGH (ref 4.6–6.5)

## 2013-02-03 MED ORDER — TADALAFIL 20 MG PO TABS: 20.0000 mg | ORAL_TABLET | Freq: Every day | ORAL | Status: DC | PRN

## 2013-02-03 NOTE — Progress Notes (Signed)
  Subjective:    Patient ID: Lee Greene, male    DOB: 10-13-1960, 52 y.o.   MRN: [id number]  HPI CPE- only concern today is erectile dysfunction.  Has never tried any meds.     Review of Systems Patient reports no vision/hearing changes, anorexia, fever ,adenopathy, persistant/recurrent hoarseness, swallowing issues, chest pain, palpitations, edema, persistant/recurrent cough, hemoptysis, dyspnea (rest,exertional, paroxysmal nocturnal), gastrointestinal  bleeding (melena, rectal bleeding), abdominal pain, excessive heart burn, GU symptoms (dysuria, hematuria, voiding/incontinence issues) syncope, focal weakness, memory loss, numbness & tingling, skin/hair/nail changes, depression, anxiety, abnormal bruising/bleeding, musculoskeletal symptoms/signs.     Objective:   Physical Exam BP 134/88  Pulse 90  Temp(Src) 98.4 F (36.9 C) (Oral)  Ht 5' 7.5" (1.715 m)  Wt 207 lb 9.6 oz (94.167 kg)  BMI 32.02 kg/m2  SpO2 96%  General Appearance:    Alert, cooperative, no distress, appears stated age  Head:    Normocephalic, without obvious abnormality, atraumatic  Eyes:    PERRL, conjunctiva/corneas clear, EOM's intact, fundi    benign, both eyes       Ears:    Normal TM's and external ear canals, both ears  Nose:   Nares normal, septum midline, mucosa normal, no drainage   or sinus tenderness  Throat:   Lips, mucosa, and tongue normal; teeth and gums normal  Neck:   Supple, symmetrical, trachea midline, no adenopathy;       thyroid:  No enlargement/tenderness/nodules  Back:     Symmetric, no curvature, ROM normal, no CVA tenderness  Lungs:     Clear to auscultation bilaterally, respirations unlabored  Chest wall:    No tenderness or deformity  Heart:    Regular rate and rhythm, S1 and S2 normal, no murmur, rub   or gallop  Abdomen:     Soft, non-tender, bowel sounds active all four quadrants,    no masses, no organomegaly  Genitalia:    Normal male without lesion, discharge or  tenderness  Rectal:    Normal tone, normal prostate, no masses or tenderness  Extremities:   Extremities normal, atraumatic, no cyanosis or edema  Pulses:   2+ and symmetric all extremities  Skin:   Skin color, texture, turgor normal, no rashes or lesions  Lymph nodes:   Cervical, supraclavicular, and axillary nodes normal  Neurologic:   CNII-XII intact. Normal strength, sensation and reflexes      throughout          Assessment & Plan:

## 2013-02-03 NOTE — Assessment & Plan Note (Signed)
Pt's PE WNL.  Check labs.  Anticipatory guidance provided.  

## 2013-02-03 NOTE — Assessment & Plan Note (Signed)
New at last OV.  Pt was instructed to return in 3 months but did not follow up.  UTD on eye exam.  Foot exam done today.  Asymptomatic.  Was working on healthy diet and regular exercise but reports recent excessive travel and weight gain.  Will follow closely.

## 2013-02-03 NOTE — Assessment & Plan Note (Signed)
New.  Check testosterone based on pt's new sxs.  Start trial of Cialis and monitor for improvement.

## 2013-02-03 NOTE — Patient Instructions (Addendum)
Follow up in 3-4 months to recheck your sugar and BP Use the Cialis as needed- start w/ 1/2 tab and only increase to a full tab if needed We'll notify you of your lab results Call with any questions or concerns Happy [place] Ouch!

## 2013-02-04 LAB — TESTOSTERONE, FREE, TOTAL, SHBG
Sex Hormone Binding: 13 nmol/L (ref 13–71)
Testosterone, Free: 51.3 pg/mL (ref 47.0–244.0)
Testosterone-% Free: 2.9 % (ref 1.6–2.9)
Testosterone: 177 ng/dL — ABNORMAL LOW (ref 300–890)

## 2013-02-18 ENCOUNTER — Telehealth: Payer: Self-pay | Admitting: *Deleted

## 2013-02-18 DIAGNOSIS — E119 Type 2 diabetes mellitus without complications: Secondary | ICD-10-CM

## 2013-02-18 DIAGNOSIS — R7989 Other specified abnormal findings of blood chemistry: Secondary | ICD-10-CM

## 2013-02-18 NOTE — Telephone Encounter (Signed)
Referral to Endo placed to pt being diabetic and having Low Testosterone

## 2013-03-07 ENCOUNTER — Telehealth: Payer: Self-pay | Admitting: General Practice

## 2013-03-07 NOTE — Telephone Encounter (Signed)
Message on triage line: Pt was calling in regards to a message he had received about scheduling an appointment. Looked in pt's chart it was [facility] that had tried calling to schedule appt. [phone]

## 2013-04-26 ENCOUNTER — Telehealth: Payer: Self-pay | Admitting: Family Medicine

## 2013-04-26 ENCOUNTER — Emergency Department (HOSPITAL_COMMUNITY)
Admission: EM | Admit: 2013-04-26 | Discharge: 2013-04-26 | Disposition: A | Discharge status: Nursing Home (Includes ICF) | Payer: BC Managed Care – PPO | Source: Home / Self Care | Attending: Family Medicine | Admitting: Family Medicine

## 2013-04-26 ENCOUNTER — Encounter (HOSPITAL_COMMUNITY): Payer: Self-pay | Admitting: Emergency Medicine

## 2013-04-26 ENCOUNTER — Emergency Department (HOSPITAL_COMMUNITY)
Admission: EM | Admit: 2013-04-26 | Discharge: 2013-04-26 | Disposition: A | Discharge status: Home / Self Care | Payer: BC Managed Care – PPO | Attending: Emergency Medicine | Admitting: Emergency Medicine

## 2013-04-26 ENCOUNTER — Emergency Department (HOSPITAL_COMMUNITY): Payer: BC Managed Care – PPO

## 2013-04-26 DIAGNOSIS — R079 Chest pain, unspecified: Secondary | ICD-10-CM

## 2013-04-26 DIAGNOSIS — R61 Generalized hyperhidrosis: Secondary | ICD-10-CM | POA: Insufficient documentation

## 2013-04-26 DIAGNOSIS — R42 Dizziness and giddiness: Secondary | ICD-10-CM | POA: Insufficient documentation

## 2013-04-26 DIAGNOSIS — R0602 Shortness of breath: Secondary | ICD-10-CM | POA: Insufficient documentation

## 2013-04-26 DIAGNOSIS — R0789 Other chest pain: Secondary | ICD-10-CM | POA: Insufficient documentation

## 2013-04-26 DIAGNOSIS — I10 Essential (primary) hypertension: Secondary | ICD-10-CM | POA: Insufficient documentation

## 2013-04-26 DIAGNOSIS — E119 Type 2 diabetes mellitus without complications: Secondary | ICD-10-CM | POA: Insufficient documentation

## 2013-04-26 LAB — BASIC METABOLIC PANEL
BUN: 15 mg/dL (ref 6–23)
CO2: 25 mEq/L (ref 19–32)
Calcium: 8.8 mg/dL (ref 8.4–10.5)
Chloride: 107 mEq/L (ref 96–112)
Creatinine, Ser: 0.96 mg/dL (ref 0.50–1.35)
GFR calc Af Amer: >90 mL/min (ref >90)
GFR calc non Af Amer: >90 mL/min (ref >90)
Glucose, Bld: 93 mg/dL (ref 70–99)
Potassium: 3.9 mEq/L (ref 3.5–5.1)
Sodium: 142 mEq/L (ref 135–145)

## 2013-04-26 LAB — POCT I-STAT TROPONIN I: Troponin i, poc: 0 ng/mL (ref 0.00–0.08)

## 2013-04-26 LAB — CBC WITH DIFFERENTIAL/PLATELET
Basophils Absolute: 0 10*3/uL (ref 0.0–0.1)
Basophils Relative: 0 % (ref 0–1)
Eosinophils Absolute: 0.2 10*3/uL (ref 0.0–0.7)
Eosinophils Relative: 2 % (ref 0–5)
HCT: 45.2 % (ref 39.0–52.0)
Hemoglobin: 15.6 g/dL (ref 13.0–17.0)
Lymphocytes Relative: 33 % (ref 12–46)
Lymphs Abs: 3.4 10*3/uL (ref 0.7–4.0)
MCH: 27 pg (ref 26.0–34.0)
MCHC: 34.5 g/dL (ref 30.0–36.0)
MCV: 78.2 fL (ref 78.0–100.0)
Monocytes Absolute: 0.8 10*3/uL (ref 0.1–1.0)
Monocytes Relative: 8 % (ref 3–12)
Neutro Abs: 5.9 10*3/uL (ref 1.7–7.7)
Neutrophils Relative %: 57 % (ref 43–77)
Platelets: 243 10*3/uL (ref 150–400)
RBC: 5.78 MIL/uL (ref 4.22–5.81)
RDW: 14.3 % (ref 11.5–15.5)
WBC: 10.3 10*3/uL (ref 4.0–10.5)

## 2013-04-26 LAB — TROPONIN I: Troponin I: <0.3 ng/mL (ref <0.30)

## 2013-04-26 MED ORDER — NITROGLYCERIN 0.4 MG SL SUBL: 0.4000 mg | SUBLINGUAL_TABLET | SUBLINGUAL | Status: DC | PRN

## 2013-04-26 MED ORDER — SODIUM CHLORIDE 0.9 % IV SOLN
Freq: Once | INTRAVENOUS | Status: AC
  Administered 2013-04-26: 17:00:00 via INTRAVENOUS

## 2013-04-26 MED ORDER — ASPIRIN 81 MG PO CHEW
CHEWABLE_TABLET | ORAL | Status: AC
  Filled 2013-04-26: qty 1

## 2013-04-26 MED ORDER — ASPIRIN 81 MG PO CHEW
324.0000 mg | CHEWABLE_TABLET | Freq: Once | ORAL | Status: AC
  Administered 2013-04-26: 324 mg via ORAL

## 2013-04-26 MED ORDER — NITROGLYCERIN 0.4 MG SL SUBL
0.4000 mg | SUBLINGUAL_TABLET | SUBLINGUAL | Status: DC | PRN
  Administered 2013-04-26: 0.4 mg via SUBLINGUAL

## 2013-04-26 MED ORDER — NITROGLYCERIN 0.4 MG SL SUBL
SUBLINGUAL_TABLET | SUBLINGUAL | Status: AC
  Filled 2013-04-26: qty 25

## 2013-04-26 NOTE — ED Provider Notes (Signed)
CSN: [id number]     Arrival date & time 04/26/13  1738 History   First MD Initiated Contact with Patient 04/26/13 1824     Chief Complaint  Patient presents with  . Chest Pain    SOB, N, Diaphoresis   (Consider location/radiation/quality/duration/timing/severity/associated sxs/prior Treatment) HPI Comments: 52 yo male with no medical hx, no cardiac hx, no smoking or cocaine presents with left chest pressure since Saturday, improved at night and then gradually recurred since Sunday afternoon, constant.  Not exertional.  Pt had mild sob/ diaphoresis initially.  Pt had asa at urgent care and sent over.  Mild relief with nitro.  No injuries.  Patient denies blood clot history, active cancer, recent major trauma or surgery, unilateral leg swelling/ pain, recent long travel, hemoptysis or oral contraceptives.  Pain mild currently.  Mild radiation to left arm.  No stress test hx  Patient is a 52 y.o. male presenting with chest pain. The history is provided by the patient.  Chest Pain Associated symptoms: diaphoresis   Associated symptoms: no abdominal pain, no back pain, no fever, no headache, no shortness of breath and not vomiting     Past Medical History  Diagnosis Date  . Diabetes mellitus without complication    History reviewed. No pertinent past surgical history. Family History  Problem Relation Age of Onset  . Hypertension Father    History  Substance Use Topics  . Smoking status: Never Smoker   . Smokeless tobacco: Never Used  . Alcohol Use: No    Review of Systems  Constitutional: Positive for diaphoresis and appetite change. Negative for fever and chills.  Eyes: Negative for visual disturbance.  Respiratory: Negative for shortness of breath.   Cardiovascular: Positive for chest pain.  Gastrointestinal: Negative for vomiting and abdominal pain.  Genitourinary: Negative for dysuria and flank pain.  Musculoskeletal: Negative for back pain, neck pain and neck stiffness.   Skin: Negative for rash.  Neurological: Positive for light-headedness. Negative for headaches.    Allergies  Review of patient's allergies indicates no known allergies.  Home Medications   Current Outpatient Rx  Name  Route  Sig  Dispense  Refill  . tadalafil (CIALIS) 20 MG tablet   Oral   Take 1 tablet (20 mg total) by mouth daily as needed for erectile dysfunction.   10 tablet   3    BP 135/85  Pulse 59  Temp(Src) 98.8 F (37.1 C) (Oral)  Resp 18  SpO2 95% Physical Exam  Nursing note and vitals reviewed. Constitutional: He is oriented to person, place, and time. He appears well-developed and well-nourished.  HENT:  Head: Normocephalic and atraumatic.  Eyes: Conjunctivae are normal. Right eye exhibits no discharge. Left eye exhibits no discharge.  Neck: Normal range of motion. Neck supple. No tracheal deviation present.  Cardiovascular: Normal rate, regular rhythm and intact distal pulses.   No murmur heard. Pulmonary/Chest: Effort normal and breath sounds normal.  Abdominal: Soft. He exhibits no distension. There is no tenderness. There is no guarding.  Musculoskeletal: He exhibits no edema and no tenderness.  Neurological: He is alert and oriented to person, place, and time.  Skin: Skin is warm. No rash noted.  Psychiatric: He has a normal mood and affect.    ED Course  Procedures (including critical care time) Labs Review Labs Reviewed  TROPONIN I  CBC WITH DIFFERENTIAL  BASIC METABOLIC PANEL  TROPONIN I  POCT I-STAT TROPONIN I   Imaging Review No results found.  EKG Interpretation  None       MDM  No diagnosis found. Chest pain, no cardiac risk factors.   Observed in ED, cp resolved.   BP elevated in ED, discussed close fup with pcp for likely starting on bp medicines. No known htn hx.  No PE risks.  Pt well appearing.  EKG no acute findings.  Delta troponin neg and pt had constant cp prior to arrival. Stressed close fup outpt for stress test  and to take baby asa until cleared.  Pt comfortable with plan.  Dg Chest 2 View  04/26/2013   CLINICAL DATA:  Mid chest pain and hypertension for 2 days  EXAM: CHEST  2 VIEW  COMPARISON:  02/12/2012  FINDINGS: Mild cardiac enlargement. Vascular pattern normal. No infiltrate or effusion.  IMPRESSION: Stable mild cardiac enlargement. No acute findings.   Electronically Signed   By: Esperanza Heir M.D.   On: 04/26/2013 20:18   Chest pain, HTN  Enid Skeens, MD 04/27/13 [phone]

## 2013-04-26 NOTE — ED Notes (Signed)
According to EMS, the patient began to experience, chest pain, with SOB, N, diaphoretic.  He took aleve and it did not go away so he went to Urgent Care.  At urgent care he got 81mg  of Aspirin, gave him one nitro with some relief.  Urgent Care called EMS, they gave him 3 more 81mg  Aspirin, did EKG and Urgent Care had already placed a #20g to the left hand.  EMS transported him to [facility]. Patient rated chest pain 6/10, now 3/10.  Patient was transported here with no complaints.

## 2013-04-26 NOTE — ED Notes (Signed)
Patient transported to X-ray 

## 2013-04-26 NOTE — ED Notes (Signed)
Reports chest pain onset Saturday. Chest tightness and feeling short of breath. Felt clammy.   Denies n/v

## 2013-04-26 NOTE — ED Notes (Signed)
Patient is alert and orientedx4.  Patient was explained discharge instructions and they understood them with no questions.  The patient's wife, Lee Greene, is taking the patient home.

## 2013-04-26 NOTE — ED Notes (Signed)
Family at bedside. 

## 2013-04-26 NOTE — Telephone Encounter (Signed)
Patient's wife states that they had a health fair at her job which the patient also attended and when they checked his blood pressure it was 142 over 110. She states that the patient was told by the health fair worker to let his PCP know about this reading.

## 2013-04-26 NOTE — ED Provider Notes (Signed)
Lee Greene is a 52 y.o. male who presents to Urgent Care today for central radiating to the left arm exertional chest pain off and on since Saturday. The symptoms returned again this morning. They are currently present. Patient notes mild to moderate central chest pain. The pain is worse with exertion. He denies any breath or palpitations nausea vomiting or diarrhea. He feels well otherwise. He has no history of prior acute coronary syndrome.    Past Medical History  Diagnosis Date  . Diabetes mellitus without complication    History  Substance Use Topics  . Smoking status: Never Smoker   . Smokeless tobacco: Not on file  . Alcohol Use: No   ROS as above Medications reviewed. Current Facility-Administered Medications  Medication Dose Route Frequency Provider Last Rate Last Dose  . 0.9 %  sodium chloride infusion   Intravenous Once Rodolph Bong, MD      . aspirin chewable tablet 324 mg  324 mg Oral Once Rodolph Bong, MD      . nitroGLYCERIN (NITROSTAT) SL tablet 0.4 mg  0.4 mg Sublingual Q5 min PRN Rodolph Bong, MD       Current Outpatient Prescriptions  Medication Sig Dispense Refill  . tadalafil (CIALIS) 20 MG tablet Take 1 tablet (20 mg total) by mouth daily as needed for erectile dysfunction.  10 tablet  3    Exam:  BP 178/101  Pulse 72  Temp(Src) 98.1 F (36.7 C) (Oral)  Resp 16  SpO2 100% Gen: Well NAD HEENT: EOMI,  MMM Lungs: CTABL Nl WOB Heart: RRR no MRG Abd: NABS, NT, ND Exts: Non edematous BL  LE, warm and well perfused.   No results found for this or any previous visit (from the past 24 hour(s)). No results found.  Twelve-lead EKG shows normal sinus rhythm with respiratory variability at 74 beats per minute. Possible left atrial enlargement. Otherwise not abnormal with no ST segment elevation or depression. Not significantly changed from EKG in 2010.  Assessment and Plan: 52 y.o. male with central left arm radiating exertional chest pain. Patient is  still somewhat symptomatic. Plan to treat with nitroglycerin aspirin oxygen therapy started IV and transferred to the emergency room via [facility]. .  Patient expresses understanding.      Rodolph Bong, MD 04/26/13 1700

## 2013-04-26 NOTE — ED Notes (Signed)
Pt placed on cardiac monitor (HR 73), and O2 by [place] @ 2 Lpm (spO2 100%)

## 2013-04-26 NOTE — ED Notes (Signed)
Patient said he started having chest pain since Saturday and it has gotten worse.  He said when he started having "chest pressure" Saturday, he also was having SOB, nausea, back pain, diaphoretic, weakness, dizziness and lightheadedness.  The patient did not want to come to the hospital but his wife made him go to Urgent Care and in turn they called EMS to bring him here.

## 2013-04-27 NOTE — Telephone Encounter (Signed)
Can you call and schedule pt a BP and diabetes follow up

## 2013-04-27 NOTE — Telephone Encounter (Signed)
Pt is due for a follow up appt to recheck BP and sugar

## 2013-04-29 ENCOUNTER — Encounter: Payer: Self-pay | Admitting: Family Medicine

## 2013-04-29 ENCOUNTER — Ambulatory Visit (INDEPENDENT_AMBULATORY_CARE_PROVIDER_SITE_OTHER): Payer: BC Managed Care – PPO | Admitting: Family Medicine

## 2013-04-29 VITALS — BP 162/96 | HR 74 | Temp 98.1°F | Resp 16 | Wt 203.4 lb

## 2013-04-29 DIAGNOSIS — I10 Essential (primary) hypertension: Secondary | ICD-10-CM | POA: Insufficient documentation

## 2013-04-29 DIAGNOSIS — Z23 Encounter for immunization: Secondary | ICD-10-CM

## 2013-04-29 DIAGNOSIS — R079 Chest pain, unspecified: Secondary | ICD-10-CM | POA: Insufficient documentation

## 2013-04-29 MED ORDER — VALSARTAN 160 MG PO TABS: 160.0000 mg | ORAL_TABLET | Freq: Every day | ORAL | Status: DC

## 2013-04-29 NOTE — Progress Notes (Signed)
  Subjective:    Patient ID: Lee Greene, male    DOB: [birth year], 52 y.o.   MRN: [id number]  HPI ER f/u- went to ER on 10/14 w/ elevated BP and CP.  Had normal EKG, CXR, and troponin.  Still having intermittent CP w/ exertion.  + SOB w/ pain.  Medical risk factors include DM, obesity.  Will have some SOB while sitting on edge of bed w/ sensation of irregular heart beat.  No associated nausea w/ CP but will have nausea independent of pain.  Is having some sensation of GERD w/ SOB.  sxs 1st noted 6 days ago.  + HAs.  No family hx of heart disease.     Review of Systems For ROS see HPI     Objective:   Physical Exam  Vitals reviewed. Constitutional: He is oriented to person, place, and time. He appears well-developed and well-nourished. No distress.  HENT:  Head: Normocephalic and atraumatic.  Eyes: Conjunctivae and EOM are normal. Pupils are equal, round, and reactive to light.  Neck: Normal range of motion. Neck supple. No thyromegaly present.  Cardiovascular: Normal rate, regular rhythm, normal heart sounds and intact distal pulses.   No murmur heard. Pulmonary/Chest: Effort normal and breath sounds normal. No respiratory distress.  Abdominal: Soft. Bowel sounds are normal. He exhibits no distension.  Musculoskeletal: He exhibits no edema.  Lymphadenopathy:    He has no cervical adenopathy.  Neurological: He is alert and oriented to person, place, and time. No cranial nerve deficit.  Skin: Skin is warm and dry.  Psychiatric: He has a normal mood and affect. His behavior is normal.          Assessment & Plan:

## 2013-04-29 NOTE — Patient Instructions (Signed)
Follow up the week of 10/27 to recheck BP Start the Diovan daily for BP We'll call you with your cardiology appt Call with any questions or concerns, particularly if your symptoms change or worsen Happy [employer]!

## 2013-05-01 NOTE — Assessment & Plan Note (Signed)
New.  No family hx of CAD but sxs concerning.  Will refer to Cards for likely stress testing.  Goal at this time is BP control.  Reviewed supportive care and red flags that should prompt return.  Pt expressed understanding and is in agreement w/ plan.

## 2013-05-01 NOTE — Assessment & Plan Note (Signed)
New.  Given recent ER readings and elevated BP today will start BP med.  Will start ARB due to pt's DM.  Reviewed supportive care and red flags that should prompt return.  Pt expressed understanding and is in agreement w/ plan.

## 2013-05-06 ENCOUNTER — Ambulatory Visit: Payer: BC Managed Care – PPO | Admitting: Cardiovascular Disease

## 2013-05-11 ENCOUNTER — Ambulatory Visit: Payer: BC Managed Care – PPO | Admitting: Family Medicine

## 2013-05-11 ENCOUNTER — Ambulatory Visit (INDEPENDENT_AMBULATORY_CARE_PROVIDER_SITE_OTHER): Payer: BC Managed Care – PPO | Admitting: Cardiovascular Disease

## 2013-05-11 ENCOUNTER — Encounter: Payer: Self-pay | Admitting: Cardiovascular Disease

## 2013-05-11 VITALS — BP 138/104 | HR 56 | Ht 69.0 in | Wt 200.0 lb

## 2013-05-11 DIAGNOSIS — I10 Essential (primary) hypertension: Secondary | ICD-10-CM

## 2013-05-11 DIAGNOSIS — Z0289 Encounter for other administrative examinations: Secondary | ICD-10-CM

## 2013-05-11 DIAGNOSIS — R079 Chest pain, unspecified: Secondary | ICD-10-CM

## 2013-05-11 MED ORDER — AMLODIPINE BESYLATE 5 MG PO TABS: 5.0000 mg | ORAL_TABLET | Freq: Every day | ORAL | Status: DC

## 2013-05-11 NOTE — Patient Instructions (Signed)
  Your physician wants you to follow-up with him after the tests                                                                 Your physician has recommended you make the following change in your medication: start amlodipine 5mg  daily   Your physician has ordered the following tests: echocardiogram, renal dopplers, and an exercise myoview

## 2013-05-11 NOTE — Progress Notes (Signed)
05/11/2013 Lee Greene Lee Greene   1961-03-11  [id number]  Primary Physician Lee Rhymes, MD Primary Cardiologist: Lee Gess MD Lee Greene   HPI:  Lee Greene is a delightful 52 year old African American male patient of Dr. 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 This is a new job in the last one month. 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 His cardiac risk factor profile is only positive for hypertension. He became hypertensive 2 weeks ago. Prior to that he was normotensive. He said blood pressures with diastolics in the 120 range and was recently begun on ARB by his primary care physician. Blood pressure readings and he takes a digital cuff reveals diastolic blood pressures in the Greene 100 range. He's had 2 episodes of substernal chest pain lasting several minutes at a time associated with shortness of breath.   Current Outpatient Prescriptions  Medication Sig Dispense Refill  . tadalafil (CIALIS) 20 MG tablet Take 1 tablet (20 mg total) by mouth daily as needed for erectile dysfunction.  10 tablet  3  . valsartan (DIOVAN) 160 MG tablet Take 1 tablet (160 mg total) by mouth daily.  30 tablet  6   No current facility-administered medications for this visit.    No Known Allergies  History   Social History  . Marital Status: Married    Spouse Name: N/A    Number of Children: N/A  . Years of Education: N/A   Occupational History  . Not on file.   Social History Main Topics  . Smoking status: Never Smoker   . Smokeless tobacco: Never Used  . Alcohol Use: No  . Drug Use: No  . Sexual Activity: Yes   Other Topics Concern  . Not on file   Social History Narrative  . No narrative on file     Review of Systems: General: negative for chills, fever, night sweats or weight changes.  Cardiovascular: negative for chest pain, dyspnea on exertion, edema,  orthopnea, palpitations, paroxysmal nocturnal dyspnea or shortness of breath Dermatological: negative for rash Respiratory: negative for cough or wheezing Urologic: negative for hematuria Abdominal: negative for nausea, vomiting, diarrhea, bright red blood per rectum, melena, or hematemesis Neurologic: negative for visual changes, syncope, or dizziness All other systems reviewed and are otherwise negative except as noted above.    Blood pressure 138/104, pulse 56, height 5\' 9"  (1.753 m), weight 200 lb (90.719 kg).  General appearance: alert and no distress Neck: no adenopathy, no carotid bruit, no JVD, supple, symmetrical, trachea midline and thyroid not enlarged, symmetric, no tenderness/mass/nodules Lungs: clear to auscultation bilaterally Heart: regular rate and rhythm, S1, S2 normal, no murmur, click, rub or gallop Abdomen: soft, non-tender; bowel sounds normal; no masses,  no organomegaly Extremities: extremities normal, atraumatic, no cyanosis or edema Pulses: 2+ and symmetric  EKG sinus bradycardia 56 without ST or T wave changes  ASSESSMENT AND PLAN:   Exertional chest pain Patient has a history of recent onset accelerated hypertension and for 5 episodes of chest pain lasting several minutes at a time associated with shortness of breath. One episode involved left upper trimming numbness/radiation. Blood pressures at home but digital cuff run in the 07/14/1928 to 140 over lower 100 range. I'm going to add amlodipine 5 mg to obtain a 2-D echo and exercise Myoview stress test to rule out an ischemic etiology.  HTN (hypertension) New onset accelerated hypertension  currently on Diovan 160. Prior to 3 weeks ago he was not hypertensive. He does have a digital blood pressure cuff and he checks his blood pressure which is elevated a routine basis. Am going to add amlodipine 5 mg a day 10 obtain any renal Doppler study to rule out renal artery stenosis.      Lee Gess MD  FACP,FACC,FAHA, [facility] 05/11/2013 12:53 PM

## 2013-05-11 NOTE — Assessment & Plan Note (Signed)
Patient has a history of recent onset accelerated hypertension and for 5 episodes of chest pain lasting several minutes at a time associated with shortness of breath. One episode involved left upper trimming numbness/radiation. Blood pressures at home but digital cuff run in the 07/14/1928 to 140 over lower 100 range. I'm going to add amlodipine 5 mg to obtain a 2-D echo and exercise Myoview stress test to rule out an ischemic etiology.

## 2013-05-11 NOTE — Assessment & Plan Note (Signed)
New onset accelerated hypertension currently on Diovan 160. Prior to 3 weeks ago he was not hypertensive. He does have a digital blood pressure cuff and he checks his blood pressure which is elevated a routine basis. Am going to add amlodipine 5 mg a day 10 obtain any renal Doppler study to rule out renal artery stenosis.

## 2013-05-12 ENCOUNTER — Encounter: Payer: Self-pay | Admitting: Cardiovascular Disease

## 2013-05-18 ENCOUNTER — Telehealth (HOSPITAL_COMMUNITY): Payer: Self-pay | Admitting: *Deleted

## 2013-05-18 ENCOUNTER — Encounter (HOSPITAL_COMMUNITY): Payer: BC Managed Care – PPO

## 2013-05-25 ENCOUNTER — Encounter (HOSPITAL_COMMUNITY): Payer: BC Managed Care – PPO

## 2013-05-26 ENCOUNTER — Inpatient Hospital Stay (HOSPITAL_COMMUNITY): Admission: RE | Admit: 2013-05-26 | Payer: BC Managed Care – PPO | Source: Ambulatory Visit

## 2013-06-02 ENCOUNTER — Ambulatory Visit: Payer: BC Managed Care – PPO | Admitting: Cardiovascular Disease

## 2014-01-12 ENCOUNTER — Encounter (HOSPITAL_COMMUNITY): Payer: Self-pay | Admitting: Emergency Medicine

## 2014-01-12 ENCOUNTER — Emergency Department (HOSPITAL_COMMUNITY)
Admission: EM | Admit: 2014-01-12 | Discharge: 2014-01-12 | Disposition: A | Discharge status: Home / Self Care | Payer: BC Managed Care – PPO | Source: Home / Self Care

## 2014-01-12 DIAGNOSIS — M65839 Other synovitis and tenosynovitis, unspecified forearm: Secondary | ICD-10-CM

## 2014-01-12 DIAGNOSIS — M65849 Other synovitis and tenosynovitis, unspecified hand: Secondary | ICD-10-CM

## 2014-01-12 DIAGNOSIS — M778 Other enthesopathies, not elsewhere classified: Secondary | ICD-10-CM

## 2014-01-12 MED ORDER — DICLOFENAC POTASSIUM 50 MG PO TABS: 50.0000 mg | ORAL_TABLET | Freq: Three times a day (TID) | ORAL | Status: DC

## 2014-01-12 NOTE — ED Notes (Signed)
Right wrist pain, onset one month ago.  No known injury .  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  Radial pulses 2 plus, cap refill brisk.

## 2014-01-12 NOTE — ED Provider Notes (Signed)
CSN: [id number]     Arrival date & time 01/12/14  1815 History   First MD Initiated Contact with Patient 01/12/14 1939     Chief Complaint  Patient presents with  . Wrist Pain   (Consider location/radiation/quality/duration/timing/severity/associated sxs/prior Treatment) HPI Comments: 53 year old male complaining of right wrist pain for one month. Pain is located primarily to the radial and ulnar aspect of the wrist. He denies any known injury. No trauma no fall or other mechanism that he can recall. He denies performing repetitive work. After the examination and diagnosis was made the patient's wife realized that he plays video games frequently.   Past Medical History  Diagnosis Date  . Diabetes mellitus without complication   . Hypertension   . Chest pain    History reviewed. No pertinent past surgical history. Family History  Problem Relation Age of Onset  . Hypertension Father    History  Substance Use Topics  . Smoking status: Never Smoker   . Smokeless tobacco: Never Used  . Alcohol Use: No    Review of Systems  Constitutional: Negative.   Respiratory: Negative.   Genitourinary: Negative.   Musculoskeletal: Negative for back pain, myalgias, neck pain and neck stiffness.       As per HPI  Skin: Negative.   Neurological: Negative for dizziness, weakness, numbness and headaches.    Allergies  Review of patient's allergies indicates no known allergies.  Home Medications   Prior to Admission medications   Medication Sig Start Date End Date Taking? Authorizing Provider  amLODipine (NORVASC) 5 MG tablet Take 1 tablet (5 mg total) by mouth daily. 05/11/13   Lorretta Harp, MD  tadalafil (CIALIS) 20 MG tablet Take 1 tablet (20 mg total) by mouth daily as needed for erectile dysfunction. 02/03/13   Midge Minium, MD  valsartan (DIOVAN) 160 MG tablet Take 1 tablet (160 mg total) by mouth daily. 04/29/13   Midge Minium, MD   BP 158/103  Pulse 80  Temp(Src)  98.3 F (36.8 C) (Oral)  Resp 18  SpO2 96% Physical Exam  Nursing note and vitals reviewed. Constitutional: He is oriented to person, place, and time. He appears well-developed and well-nourished.  HENT:  Head: Normocephalic and atraumatic.  Eyes: EOM are normal.  Neck: Normal range of motion. Neck supple.  Pulmonary/Chest: Effort normal. No respiratory distress.  Musculoskeletal:  Attempt with Finklestein's sign produces positive response with slight movement; same response to ulnar tendon. Median wrist nontender. Full flex and ext. Radial and ulnar deviation is painful. Radial pulse intact. Distal N/V, M/S intact.  Neurological: He is alert and oriented to person, place, and time. No cranial nerve deficit.  Skin: Skin is warm and dry.  Psychiatric: He has a normal mood and affect.    ED Course  Procedures (including critical care time) Labs Review Labs Reviewed - No data to display  Imaging Review No results found.   MDM   1. Right wrist tendinitis     Wrist splint 7-10 d. Limit use of the thumb cata[place]am Ice locally    Janne Napoleon, NP 01/12/14 2125

## 2014-01-12 NOTE — Discharge Instructions (Signed)
Extensor Pollicis Longus Tendinitis Extensor pollicis longus (EPL) tendonitis is a condition in which the EPL tendon lining (sheath) becomes inflamed. This causes pain on the thumb side (radial side) of the back of the wrist. The EPL tendon attaches the EPL muscle to the bone. The EPL muscle straightens the thumb. The tendon lining secretes a lubricant that allows the EPL to glide smoothly. When the lining becomes inflamed, the tendon can not glide smoothly, which causes pain. There are three grades of EPL tendonitis. A grade 1 strain is a mild strain, where the tendon experiences a slight pull, but no obvious tearing (microscopic tearing). There is no loss of strength, and the tendon is the correct length. Grade 2 strains are a moderate strain. There is tearing of tendon fibers within the tendon or where the tendon meets the bone or muscle. Tearing of the tendon causes the length of the whole muscle-tendon-bone unit to increase. A grade 2 injury usually results in decreased strength. A grade 3 strain is a complete rupture of the tendon.  CAUSES   EPL tendinitis is often an overuse injury and caused by repeated motions of the hand and wrist, due to friction of the tendon within the lining.  Sudden increase in activity or change in activity. SYMPTOMS   Vague, diffuse pain and tenderness over the thumb side of the back of the wrist.  Pain that gets worse when straightening the thumb.  Locking, catching, or triggering of the thumb joint.  Limited motion of the thumb.  Little or no swelling, warmth, or redness of the back of the wrist.  Crackling sound (crepitation) when the tendon or wrist is moved or touched. RISK INCREASES WITH:  Sports that involve repeated hand and wrist motions (tennis, golf, bowling).  Previous wrist injury.  Heavy labor.  Poor wrist strength and flexibility.  Failure to warm up properly before activity. PREVENTION   Warm up and stretch properly before  activity.  Allow time for adequate rest and recovery between practices and competition.  Maintain physical fitness:  Forearm, wrist, and hand flexibility.  Muscle strength and endurance.  Learn and use proper exercise technique. PROGNOSIS  This condition is often curable within 6 weeks, if treated properly with non-surgical treatment and resting the affected area. Surgery may be advised. RELATED COMPLICATIONS   Longer healing time, if not properly treated, or if not given adequate time to heal.  Chronic inflammation of the EPL tendon, causing pain.  Recurring of symptoms, especially if activity is resumed too soon.  Risks of surgery: infection, bleeding, injury to nerves (numbness of the thumb), continued pain, incomplete release of the tendon lining, recurring symptoms, cutting of the tendon, thumb weakness, thumb stiffness, and inability to straighten the thumb. TREATMENT  Treatment first involves ice and medicine to reduce pain and inflammation. Stretching and strengthening exercises, along with activity modification, may be advised to reduce painful symptoms. For certain cases, a brace, splint, or taping may be advised, to reduce wrist movement during activity. Your caregiver may recommend a corticosteroid injection to reduce inflammation. If non-surgical treatment is unsuccessful, surgery may be advised to release the inflamed tendon. If the tendon is torn, it may require repair or replacement (reconstruction). MEDICATION   If pain medicine is needed, nonsteroidal anti-inflammatory medicines, (aspirin and ibuprofen), or other minor pain relievers (acetaminophen), are often advised.  Do not take pain medicine for 7 days before surgery.  Prescription pain relievers are usually only prescribed after surgery. Use only as directed and only  as much as you need.  Cortisone injections reduce inflammation. However, these are used only in extreme cases, because there is a limit to the  number of times they may be given. These injections may weaken muscle and tendon tissue. Anesthetics also temporarily relieve pain. COLD THERAPY  Cold treatment (icing) relieves pain and reduces inflammation. Cold treatment should be applied for 10 to 15 minutes every 2 to 3 hours, and immediately after activity that aggravates your symptoms. Use ice packs or an ice massage. SEEK MEDICAL CARE IF:   Symptoms get worse or do not improve in 2 to 4 weeks, despite treatment.  You experience pain, numbness, or coldness in the hand.  Blue, gray, or dark color appears in the fingernails.  Any of the following occur after surgery: increased pain, swelling, redness, drainage of fluids, bleeding in the affected area, or signs of infection.  New, unexplained symptoms develop. (Drugs used in treatment may produce side effects.) Document Released: 06/30/2005 Document Revised: 09/22/2011 Document Reviewed: 10/12/2008 [facility] Patient Information 2015 [place], [place]. This information is not intended to replace advice given to you by your health care provider. Make sure you discuss any questions you have with your health care provider.   Repetitive Strain Injuries Repetitive strain injuries (RSIs) result from overuse or misuse of soft tissues including muscles, tendons, or nerves. Tendons are the cord-like structures that attach muscles to bones. RSIs can affect almost any part of the body. However, RSIs are most common in the arms (thumbs, wrists, elbows, shoulders) and legs (ankles, knees). Common medical conditions that are often caused by repetitive strain include carpal tunnel syndrome, tennis or golfer's elbow, bursitis, and tendonitis. If RSIs are treated early, and therepeated activity is reduced or removed, the severity and length of your problems can usually be reduced. RSIs are also called cumulative trauma disorders (CTD).  CAUSES  Many RSIs occur due to repeating the same activity at work over  weeks or months without sufficient rest, such as prolonged typing. RSIs also commonly occur when a hobby or sport is done repeatedly without sufficient rest. RSIs can also occur due to repeated strain or stress on a body part in someone who has one or more risk factors for RSIs. RISK FACTORS Workplace risk factors  Frequent computer use, especially if your workstation is not adjusted for your body type.  Infrequent rest breaks.  Working in a high-pressure environment.  Working at a [employer].  Repeating the same motion, such as frequent typing.  Working in an awkward position or holding the same position for a long time.  Forceful movements such as lifting, pulling, or pushing.  Vibration caused by using power tools.  Working in cold temperatures.  Job stress. Personal risk factors  Poor posture.  Being loose-jointed.  Not exercising regularly.  Being overweight.  Arthritis, diabetes, thyroid problems, or other long-term (chronic)medical conditions.  Vitamin deficiencies.  Keeping your fingernails long.  An unhealthy, stressful, or inactive lifestyle.  Not sleeping well. SYMPTOMS  Symptoms often begin at work but become more noticeable after the repeated stress has ended. For example, you may develop fatigue or soreness in your wrist while typingat work, and at night you may develop numbness and tingling in your fingers. Common symptoms include:   Burning, shooting, or aching pain, especially in the fingers, palms, wrists, forearms, or shoulders.  Tenderness.  Swelling.  Tingling, numbness, or loss of feeling.  Pain with certain activities, such as turning a doorknob or reaching above your head.  Weakness, heaviness, or loss of coordination in yourhand.  Muscle spasms or tightness. In some cases, symptoms can become so intense that it is difficult to perform everyday tasks. Symptoms that do not improve with rest may indicate a more serious condition.   DIAGNOSIS  Your caregiver may determine the type ofRSI you have based on your medical evaluation and a description of your activities.  TREATMENT  Treatment depends on the severity and type of RSI you have. Your caregiver may recommend rest for the affected body part, medicines, and physical or occupational therapy to reduce pain, swelling, and soreness. Discuss the activities you do repeatedly with your caregiver. Your caregiver can help you decide whether you need to change your activities. An RSI may take months or years to heal, especially if the affected body part gets insufficient rest. In some cases, such as severe carpal tunnel syndrome, surgery may be recommended. PREVENTION  Talk with your supervisor to make sure you have the proper equipment for your work station.  Maintain good posture at your desk or work station with:  Feet flat on the floor.  Knees directly over the feet, bent at a right angle.  Lower back supported by your chair or a cushion in the curve of your lower back.  Shoulders and arms relaxed and at your sides.  Neck relaxed and not bent forwards or backwards.  Your desk and computer workstation properly adjusted to your body type.  Your chair adjusted so there is no excess pressure on the back of your thighs.  The keyboard resting above your thighs. You should be able to reach the keys with your elbows at your side, bent at a right angle. Your arms should be supported on forearm rests, with your forearms parallel to the ground.  The computer mouse within easy reach.  The monitor directly in front of you, so that your eyes are aligned with the top of the screen. The screen should be about 15 to 25 inches from your eyes.  While typing, keep your wrist straight, in a neutral position. Move your entire arm when you move your mouse or when typing hard-to-reach keys.  Only use your computer as much as you need to for work. Do not use it during breaks.  Take  breaks often from any repeated activity. Alternate with another task which requires you to use different muscles, or rest at least once every hour.  Change positions regularly. If you spend a lot of time sitting, get up, walk around, and stretch.  Do not hold pens or pencils tightly when writing.  Exercise regularly.  Maintain a normal weight.  Eat a diet with plenty of vegetables, whole grains, and fruit.  Get sufficient, restful sleep. HOME CARE INSTRUCTIONS  If your caregiver prescribed medicine to help reduce swelling, take it as directed.  Only take over-the-counter or prescription medicines for pain, discomfort, or fever as directed by your caregiver.  Reduce, and if needed, stopthe activities that are causing your problems until you have no further symptoms.If your symptoms are work-related, you may need to talk to your supervisor about changing your activities.  When symptoms develop, put ice or a cold pack on the aching area.  Put ice in a plastic bag.  Place a towel between your skin and the bag.  Leave the ice on for 15-20 minutes.  If you were given a splint to keep your wrist from bending, wear it as instructed. It is important to wear the splint at  night. Use the splint for as long as your caregiver recommends. SEEK MEDICAL CARE IF:  You develop new problems.  Your problems do not get better with medicine. MAKE SURE YOU:  Understand these instructions.  Will watch your condition.  Will get help right away if you are not doing well or get worse. Document Released: 06/20/2002 Document Revised: 12/30/2011 Document Reviewed: 08/21/2011 [facility] Patient Information 2015 [place], [place]. This information is not intended to replace advice given to you by your health care provider. Make sure you discuss any questions you have with your health care provider.  Tendinitis Tendinitis is swelling and inflammation of the tendons. Tendons are band-like tissues that  connect muscle to bone. Tendinitis commonly occurs in the:   Shoulders (rotator cuff).  Heels (Achilles tendon).  Elbows (triceps tendon). CAUSES Tendinitis is usually caused by overusing the tendon, muscles, and joints involved. When the tissue surrounding a tendon (synovium) becomes inflamed, it is called tenosynovitis. Tendinitis commonly develops in people whose jobs require repetitive motions. SYMPTOMS  Pain.  Tenderness.  Mild swelling. DIAGNOSIS Tendinitis is usually diagnosed by physical exam. Your health care provider may also order X-rays or other imaging tests. TREATMENT Your health care provider may recommend certain medicines or exercises for your treatment. HOME CARE INSTRUCTIONS   Use a sling or splint for as long as directed by your health care provider until the pain decreases.  Put ice on the injured area.  Put ice in a plastic bag.  Place a towel between your skin and the bag.  Leave the ice on for 15-20 minutes, 3-4 times a day, or as directed by your health care provider.  Avoid using the limb while the tendon is painful. Perform gentle range of motion exercises only as directed by your health care provider. Stop exercises if pain or discomfort increase, unless directed otherwise by your health care provider.  Only take over-the-counter or prescription medicines for pain, discomfort, or fever as directed by your health care provider. SEEK MEDICAL CARE IF:   Your pain and swelling increase.  You develop new, unexplained symptoms, especially increased numbness in the hands. MAKE SURE YOU:   Understand these instructions.  Will watch your condition.  Will get help right away if you are not doing well or get worse. Document Released: 06/27/2000 Document Revised: 07/05/2013 Document Reviewed: 09/16/2010 [facility] Patient Information 2015 [place], [place]. This information is not intended to replace advice given to you by your health care provider. Make  sure you discuss any questions you have with your health care provider.

## 2014-01-13 NOTE — ED Provider Notes (Signed)
Medical screening examination/treatment/procedure(s) were performed by a resident physician or non-physician practitioner and as the supervising physician I was immediately available for consultation/collaboration.  Lynne Leader, MD    Gregor Hams, MD 01/13/14 [phone]

## 2014-05-08 ENCOUNTER — Telehealth: Payer: Self-pay

## 2014-05-08 ENCOUNTER — Other Ambulatory Visit: Payer: Self-pay | Admitting: Family Medicine

## 2014-05-08 NOTE — Telephone Encounter (Signed)
[place] for #30, no refills.  NEEDS appt

## 2014-05-08 NOTE — Telephone Encounter (Signed)
Please see refill encounter note on 05/08/14.

## 2014-05-08 NOTE — Telephone Encounter (Signed)
Last OV 04-29-13, no upcoming appts  Valsartan last filled 04-29-13 #30 with 6

## 2014-05-08 NOTE — Telephone Encounter (Signed)
Pt made aware.  Medication follow up scheduled for Monday, May 22, 2014 @ 0915 with Dr. Birdie Riddle.

## 2014-05-08 NOTE — Telephone Encounter (Signed)
Called and left a message for call back  

## 2014-05-22 ENCOUNTER — Ambulatory Visit: Payer: BC Managed Care – PPO | Admitting: Family Medicine

## 2014-05-24 ENCOUNTER — Ambulatory Visit (INDEPENDENT_AMBULATORY_CARE_PROVIDER_SITE_OTHER): Payer: BC Managed Care – PPO | Admitting: Family Medicine

## 2014-05-24 ENCOUNTER — Encounter: Payer: Self-pay | Admitting: Family Medicine

## 2014-05-24 VITALS — BP 136/90 | HR 71 | Temp 98.4°F | Resp 16 | Wt 218.0 lb

## 2014-05-24 DIAGNOSIS — I10 Essential (primary) hypertension: Secondary | ICD-10-CM

## 2014-05-24 DIAGNOSIS — E119 Type 2 diabetes mellitus without complications: Secondary | ICD-10-CM

## 2014-05-24 NOTE — Assessment & Plan Note (Signed)
Chronic problem.  Mildly elevated but not high enough to warrant med change.  Stressed need for low Na diet and regular exercise.  Check labs.  No anticipated med changes.

## 2014-05-24 NOTE — Progress Notes (Signed)
Pre visit review using our clinic review tool, if applicable. No additional management support is needed unless otherwise documented below in the visit note. 

## 2014-05-24 NOTE — Assessment & Plan Note (Signed)
Chronic problem.  Pt has not required meds in the past, has been able to control w/ diet.  Pt has gained 18 lbs in last year.  Stressed need for low carb diet and regular exercise.  Pt plans for eye exam in January.  On ARB.  Check labs.  Start meds prn.

## 2014-05-24 NOTE — Progress Notes (Signed)
   Subjective:    Patient ID: [place], male    DOB: 1961/06/22, 53 y.o.   MRN: [id number]  HPI HTN- chronic problem, on Valsartan and Amlodipine daily.  BP is mildly elevated today.  + weight gain- 18 lbs in last year.  Denies CP, SOB, HAs, visual changes, edema.  DM- chronic problem, has been diet controlled.  On ARB.  Has gained nearly 20 lbs in last year.  Denies N/V/D.  Pt is due for eye exam in January 2016.  No numbness/tingling of hands/feet.   Review of Systems For ROS see HPI     Objective:   Physical Exam  Constitutional: He is oriented to person, place, and time. He appears well-developed and well-nourished. No distress.  HENT:  Head: Normocephalic and atraumatic.  Eyes: Conjunctivae and EOM are normal. Pupils are equal, round, and reactive to light.  Neck: Normal range of motion. Neck supple. No thyromegaly present.  Cardiovascular: Normal rate, regular rhythm, normal heart sounds and intact distal pulses.   No murmur heard. Pulmonary/Chest: Effort normal and breath sounds normal. No respiratory distress.  Abdominal: Soft. Bowel sounds are normal. He exhibits no distension.  Musculoskeletal: He exhibits no edema.  Lymphadenopathy:    He has no cervical adenopathy.  Neurological: He is alert and oriented to person, place, and time. No cranial nerve deficit.  Skin: Skin is warm and dry.  Psychiatric: He has a normal mood and affect. His behavior is normal.  Vitals reviewed.         Assessment & Plan:

## 2014-05-24 NOTE — Patient Instructions (Signed)
Schedule your complete physical in 3-4 months We'll notify you of your lab results and make any changes if needed Try and make healthy food choices and get regular exercise Call with any questions or concerns Happy Holidays!!!

## 2014-05-25 ENCOUNTER — Encounter: Payer: Self-pay | Admitting: General Practice

## 2014-05-25 LAB — HEPATIC FUNCTION PANEL
ALT: 33 U/L (ref 0–53)
AST: 24 U/L (ref 0–37)
Albumin: 3.9 g/dL (ref 3.5–5.2)
Alkaline Phosphatase: 71 U/L (ref 39–117)
Bilirubin, Direct: 0.1 mg/dL (ref 0.0–0.3)
Total Bilirubin: 1 mg/dL (ref 0.2–1.2)
Total Protein: 7.5 g/dL (ref 6.0–8.3)

## 2014-05-25 LAB — LIPID PANEL
Cholesterol: 152 mg/dL (ref 0–200)
HDL: 58.5 mg/dL (ref >39.00)
LDL Cholesterol: 71 mg/dL (ref 0–99)
NonHDL: 93.5
Total CHOL/HDL Ratio: 3
Triglycerides: 112 mg/dL (ref 0.0–149.0)
VLDL: 22.4 mg/dL (ref 0.0–40.0)

## 2014-05-25 LAB — CBC WITH DIFFERENTIAL/PLATELET
Basophils Absolute: 0 10*3/uL (ref 0.0–0.1)
Basophils Relative: 0.1 % (ref 0.0–3.0)
Eosinophils Absolute: 0.2 10*3/uL (ref 0.0–0.7)
Eosinophils Relative: 2.6 % (ref 0.0–5.0)
HCT: 48.8 % (ref 39.0–52.0)
Hemoglobin: 15.7 g/dL (ref 13.0–17.0)
Lymphocytes Relative: 33.5 % (ref 12.0–46.0)
Lymphs Abs: 2.7 10*3/uL (ref 0.7–4.0)
MCHC: 32.1 g/dL (ref 30.0–36.0)
MCV: 79.9 fl (ref 78.0–100.0)
Monocytes Absolute: 0.6 10*3/uL (ref 0.1–1.0)
Monocytes Relative: 7.3 % (ref 3.0–12.0)
Neutro Abs: 4.6 10*3/uL (ref 1.4–7.7)
Neutrophils Relative %: 56.5 % (ref 43.0–77.0)
Platelets: 242 10*3/uL (ref 150.0–400.0)
RBC: 6.11 Mil/uL — ABNORMAL HIGH (ref 4.22–5.81)
RDW: 14.1 % (ref 11.5–15.5)
WBC: 8.1 10*3/uL (ref 4.0–10.5)

## 2014-05-25 LAB — BASIC METABOLIC PANEL
BUN: 14 mg/dL (ref 6–23)
CO2: 23 mEq/L (ref 19–32)
Calcium: 9 mg/dL (ref 8.4–10.5)
Chloride: 109 mEq/L (ref 96–112)
Creatinine, Ser: 1 mg/dL (ref 0.4–1.5)
GFR: 106.15 mL/min (ref >60.00)
Glucose, Bld: 101 mg/dL — ABNORMAL HIGH (ref 70–99)
Potassium: 4 mEq/L (ref 3.5–5.1)
Sodium: 141 mEq/L (ref 135–145)

## 2014-05-25 LAB — HEMOGLOBIN A1C: Hgb A1c MFr Bld: 6.8 % — ABNORMAL HIGH (ref 4.6–6.5)

## 2014-05-25 LAB — TSH: TSH: 1.13 u[IU]/mL (ref 0.35–4.50)

## 2014-05-31 ENCOUNTER — Other Ambulatory Visit: Payer: Self-pay | Admitting: Family Medicine

## 2014-06-01 NOTE — Telephone Encounter (Signed)
Med filled.  

## 2014-08-14 LAB — HM DIABETES EYE EXAM

## 2014-09-19 ENCOUNTER — Telehealth: Payer: Self-pay | Admitting: General Practice

## 2014-09-19 NOTE — Telephone Encounter (Signed)
Called pt about his Friday appointment. Pt is due for a CPE, and was scheduled for a follow up only. Birdie Riddle  was wondering if he could come in at 11am instead of 8:30. If not,  KEEP  pt at 8:30 in the am and we will make it work.

## 2014-09-20 NOTE — Telephone Encounter (Signed)
Noted  

## 2014-09-20 NOTE — Telephone Encounter (Signed)
Called PT to rescheduled appt- lm on home number- attempted cell , no vm set up.

## 2014-09-22 ENCOUNTER — Ambulatory Visit: Payer: BC Managed Care – PPO | Admitting: Family Medicine

## 2014-09-22 ENCOUNTER — Telehealth: Payer: Self-pay | Admitting: Family Medicine

## 2014-09-22 NOTE — Telephone Encounter (Signed)
error 

## 2014-09-22 NOTE — Telephone Encounter (Signed)
Ok to schedule pt here.

## 2014-09-22 NOTE — Telephone Encounter (Signed)
Per Janett Billow, Dr. Birdie Riddle medical assistant. Ok to use a hospital follow up slot for this patient cpe.   Patient will be out of town and is requesting first of April! CPE scheduled for 10/17/14 at 11:00

## 2014-09-28 ENCOUNTER — Telehealth: Payer: Self-pay | Admitting: Family Medicine

## 2014-09-28 NOTE — Telephone Encounter (Signed)
Pre visit CPE letter sent

## 2014-10-17 ENCOUNTER — Encounter: Payer: BLUE CROSS/BLUE SHIELD | Admitting: Family Medicine

## 2014-10-17 ENCOUNTER — Ambulatory Visit: Payer: Self-pay | Admitting: Family Medicine

## 2014-10-17 DIAGNOSIS — Z0289 Encounter for other administrative examinations: Secondary | ICD-10-CM

## 2014-10-23 ENCOUNTER — Encounter: Payer: Self-pay | Admitting: Family Medicine

## 2014-10-23 ENCOUNTER — Telehealth: Payer: Self-pay | Admitting: Family Medicine

## 2014-10-23 NOTE — Telephone Encounter (Signed)
Yes charge- we made a special slot for him and he didn't show

## 2014-10-23 NOTE — Telephone Encounter (Signed)
Pt was no show for CPE on 10/17/14- letter sent- charge?

## 2015-04-13 ENCOUNTER — Other Ambulatory Visit: Payer: Self-pay | Admitting: Family Medicine

## 2015-04-17 NOTE — Telephone Encounter (Signed)
Medication filled to pharmacy as requested.   

## 2015-05-15 ENCOUNTER — Encounter: Payer: Self-pay | Admitting: Gastroenterology

## 2015-05-15 ENCOUNTER — Ambulatory Visit (INDEPENDENT_AMBULATORY_CARE_PROVIDER_SITE_OTHER): Payer: BLUE CROSS/BLUE SHIELD | Admitting: Family Medicine

## 2015-05-15 ENCOUNTER — Encounter: Payer: Self-pay | Admitting: Family Medicine

## 2015-05-15 ENCOUNTER — Telehealth: Payer: Self-pay | Admitting: Family Medicine

## 2015-05-15 VITALS — BP 126/82 | HR 86 | Temp 98.0°F | Resp 16 | Ht 69.0 in | Wt 223.1 lb

## 2015-05-15 DIAGNOSIS — Z0000 Encounter for general adult medical examination without abnormal findings: Secondary | ICD-10-CM

## 2015-05-15 DIAGNOSIS — E119 Type 2 diabetes mellitus without complications: Secondary | ICD-10-CM | POA: Diagnosis not present

## 2015-05-15 DIAGNOSIS — Z1211 Encounter for screening for malignant neoplasm of colon: Secondary | ICD-10-CM

## 2015-05-15 LAB — CBC WITH DIFFERENTIAL/PLATELET
Basophils Absolute: 0 10*3/uL (ref 0.0–0.1)
Basophils Relative: 0.5 % (ref 0.0–3.0)
Eosinophils Absolute: 0.1 10*3/uL (ref 0.0–0.7)
Eosinophils Relative: 1.6 % (ref 0.0–5.0)
HCT: 49.3 % (ref 39.0–52.0)
Hemoglobin: 16.1 g/dL (ref 13.0–17.0)
Lymphocytes Relative: 38 % (ref 12.0–46.0)
Lymphs Abs: 3.4 10*3/uL (ref 0.7–4.0)
MCHC: 32.6 g/dL (ref 30.0–36.0)
MCV: 79.1 fl (ref 78.0–100.0)
Monocytes Absolute: 0.8 10*3/uL (ref 0.1–1.0)
Monocytes Relative: 8.5 % (ref 3.0–12.0)
Neutro Abs: 4.5 10*3/uL (ref 1.4–7.7)
Neutrophils Relative %: 51.4 % (ref 43.0–77.0)
Platelets: 262 10*3/uL (ref 150.0–400.0)
RBC: 6.23 Mil/uL — ABNORMAL HIGH (ref 4.22–5.81)
RDW: 14.4 % (ref 11.5–15.5)
WBC: 8.8 10*3/uL (ref 4.0–10.5)

## 2015-05-15 LAB — HEPATIC FUNCTION PANEL
ALT: 25 U/L (ref 0–53)
AST: 17 U/L (ref 0–37)
Albumin: 4.2 g/dL (ref 3.5–5.2)
Alkaline Phosphatase: 83 U/L (ref 39–117)
Bilirubin, Direct: 0.2 mg/dL (ref 0.0–0.3)
Total Bilirubin: 0.8 mg/dL (ref 0.2–1.2)
Total Protein: 7.2 g/dL (ref 6.0–8.3)

## 2015-05-15 LAB — BASIC METABOLIC PANEL
BUN: 15 mg/dL (ref 6–23)
CO2: 30 mEq/L (ref 19–32)
Calcium: 9.6 mg/dL (ref 8.4–10.5)
Chloride: 105 mEq/L (ref 96–112)
Creatinine, Ser: 1.19 mg/dL (ref 0.40–1.50)
GFR: 82.53 mL/min (ref >60.00)
Glucose, Bld: 118 mg/dL — ABNORMAL HIGH (ref 70–99)
Potassium: 4.2 mEq/L (ref 3.5–5.1)
Sodium: 143 mEq/L (ref 135–145)

## 2015-05-15 LAB — LIPID PANEL
Cholesterol: 132 mg/dL (ref 0–200)
HDL: 70.5 mg/dL (ref >39.00)
LDL Cholesterol: 45 mg/dL (ref 0–99)
NonHDL: 61.99
Total CHOL/HDL Ratio: 2
Triglycerides: 87 mg/dL (ref 0.0–149.0)
VLDL: 17.4 mg/dL (ref 0.0–40.0)

## 2015-05-15 LAB — HEMOGLOBIN A1C: Hgb A1c MFr Bld: 6.9 % — ABNORMAL HIGH (ref 4.6–6.5)

## 2015-05-15 LAB — TSH: TSH: 0.82 u[IU]/mL (ref 0.35–4.50)

## 2015-05-15 LAB — PSA: PSA: 0.4 ng/mL (ref 0.10–4.00)

## 2015-05-15 MED ORDER — TADALAFIL 20 MG PO TABS: 20.0000 mg | ORAL_TABLET | Freq: Every day | ORAL | Status: DC | PRN

## 2015-05-15 NOTE — Assessment & Plan Note (Addendum)
Pt's PE WNL w/ exception of obesity.  Pt declines GU exam.  Due for colonoscopy- referral placed.  Declines flu shot.  Check labs.  Anticipatory guidance provided.

## 2015-05-15 NOTE — Progress Notes (Signed)
Pre visit review using our clinic review tool, if applicable. No additional management support is needed unless otherwise documented below in the visit note. 

## 2015-05-15 NOTE — Telephone Encounter (Signed)
Medication filled to pharmacy as requested.   

## 2015-05-15 NOTE — Telephone Encounter (Signed)
Relation to XQ:KSKS Call back number:[phone] Pharmacy: [id number] - [place], [address] AT [address]  Reason for call:  Patient requesting a refill tadalafil (CIALIS) 20 MG tablet

## 2015-05-15 NOTE — Assessment & Plan Note (Signed)
Chronic problem.  Attempting to control w/ healthy diet but pt admits to poor dietary and exercise compliance.  Stressed need for both.  UTD on eye exam.  On ARB for renal protection.  Foot exam done today.  Will continue to follow.

## 2015-05-15 NOTE — Patient Instructions (Signed)
Follow up in 4 months to recheck diabetes We'll notify you of your lab results and make any changes if needed Try and make healthy food choices and get regular exercise- you can do it! We'll call you with your GI appt for colonoscopy consultation Have your eye doctor send me copies of their report when you go Call with any questions or concerns If you want to join [place] at the new [place] office, any scheduled appointments will automatically transfer and we will see you at [address] [place] [address] [place], [place] [address]  Happy Holidays!!!

## 2015-05-15 NOTE — Progress Notes (Signed)
   Subjective:    Patient ID: Lee [place], male    DOB: [birth year], 54 y.o.   MRN: [id number]  HPI CPE- due for colonoscopy.  UTD on eye exam.  Due for foot exam.  On ARB for renal protection.  Attempting to control diabetes w/ healthy diet and regular exercise- but pt has gained 5 lbs.  Pt declines flu shot   Review of Systems Patient reports no vision/hearing changes, anorexia, fever ,adenopathy, persistant/recurrent hoarseness, swallowing issues, chest pain, palpitations, edema, persistant/recurrent cough, hemoptysis, dyspnea (rest,exertional, paroxysmal nocturnal), gastrointestinal  bleeding (melena, rectal bleeding), abdominal pain, excessive heart burn, GU symptoms (dysuria, hematuria, voiding/incontinence issues) syncope, focal weakness, memory loss, numbness & tingling, skin/hair/nail changes, depression, anxiety, abnormal bruising/bleeding, musculoskeletal symptoms/signs.     Objective:   Physical Exam BP 126/82 mmHg  Pulse 86  Temp(Src) 98 F (36.7 C) (Oral)  Resp 16  Ht 5\' 9"  (1.753 m)  Wt 223 lb 2 oz (101.209 kg)  BMI 32.93 kg/m2  SpO2 96%  General Appearance:    Alert, cooperative, no distress, appears stated age, obese  Head:    Normocephalic, without obvious abnormality, atraumatic  Eyes:    PERRL, conjunctiva/corneas clear, EOM's intact, fundi    benign, both eyes       Ears:    Normal TM's and external ear canals, both ears  Nose:   Nares normal, septum midline, mucosa normal, no drainage   or sinus tenderness  Throat:   Lips, mucosa, and tongue normal; teeth and gums normal  Neck:   Supple, symmetrical, trachea midline, no adenopathy;       thyroid:  No enlargement/tenderness/nodules  Back:     Symmetric, no curvature, ROM normal, no CVA tenderness  Lungs:     Clear to auscultation bilaterally, respirations unlabored  Chest wall:    No tenderness or deformity  Heart:    Regular rate and rhythm, S1 and S2 normal, no murmur, rub   or gallop  Abdomen:      Soft, non-tender, bowel sounds active all four quadrants,    no masses, no organomegaly  Genitalia:    Deferred at pt's request  Rectal:    Extremities:   Extremities normal, atraumatic, no cyanosis or edema  Pulses:   2+ and symmetric all extremities  Skin:   Skin color, texture, turgor normal, no rashes or lesions  Lymph nodes:   Cervical, supraclavicular, and axillary nodes normal  Neurologic:   CNII-XII intact. Normal strength, sensation and reflexes      throughout          Assessment & Plan:

## 2015-05-16 ENCOUNTER — Encounter: Payer: Self-pay | Admitting: General Practice

## 2015-07-03 ENCOUNTER — Telehealth: Payer: Self-pay | Admitting: *Deleted

## 2015-07-03 ENCOUNTER — Ambulatory Visit (AMBULATORY_SURGERY_CENTER): Payer: Self-pay | Admitting: *Deleted

## 2015-07-03 VITALS — Ht 69.0 in | Wt 223.6 lb

## 2015-07-03 DIAGNOSIS — Z1211 Encounter for screening for malignant neoplasm of colon: Secondary | ICD-10-CM

## 2015-07-03 MED ORDER — NA SULFATE-K SULFATE-MG SULF 17.5-3.13-1.6 GM/177ML PO SOLN: 1.0000 | Freq: Once | ORAL | Status: DC

## 2015-07-03 NOTE — Progress Notes (Signed)
No egg or soy allergy known to patient  No issues with past sedation with any surgeries  or procedures, no intubation problems  No diet pills No home 02 use per patient   emmi video to e mail  [email or web address]

## 2015-07-03 NOTE — Telephone Encounter (Signed)
Pt called after PV today states his suprep is 86$ with insurance and even with a coupon he will not be able to swing this price. Sample suprep given. Instructed pt to pick up 4 th floor desk when he can  Medication Samples have been provided to the patient.  Drug name: suprep  Qty: 1  LOTLF:[id number]  Exp.Date: 11/18  The patient has been instructed regarding the correct time, dose, and frequency of taking this medication, including desired effects and most common side effects.   Hetty Ely Widener 11:18 AM 07/03/2015

## 2015-07-17 ENCOUNTER — Ambulatory Visit (AMBULATORY_SURGERY_CENTER): Payer: BLUE CROSS/BLUE SHIELD | Admitting: Gastroenterology

## 2015-07-17 ENCOUNTER — Encounter: Payer: Self-pay | Admitting: Gastroenterology

## 2015-07-17 VITALS — BP 150/105 | HR 74 | Temp 98.5°F | Resp 18 | Ht 69.0 in | Wt 223.0 lb

## 2015-07-17 DIAGNOSIS — D124 Benign neoplasm of descending colon: Secondary | ICD-10-CM

## 2015-07-17 DIAGNOSIS — Z1211 Encounter for screening for malignant neoplasm of colon: Secondary | ICD-10-CM

## 2015-07-17 DIAGNOSIS — D122 Benign neoplasm of ascending colon: Secondary | ICD-10-CM | POA: Diagnosis not present

## 2015-07-17 MED ORDER — SODIUM CHLORIDE 0.9 % IV SOLN: 500.0000 mL | INTRAVENOUS | Status: DC

## 2015-07-17 NOTE — Patient Instructions (Signed)

## 2015-07-17 NOTE — Progress Notes (Signed)
To recovery, report to Scott, RN, VSS 

## 2015-07-17 NOTE — Op Note (Signed)
[place]  [address]. [place], [address]   COLONOSCOPY PROCEDURE REPORT  PATIENT: Lee Greene, Lee Greene  MR#: [id number] BIRTHDATE: [birth year] , 19  yrs. old GENDER: male ENDOSCOPIST: Milus Banister, MD REFERRED [id number] Birdie Riddle, M.D. PROCEDURE DATE:  07/17/2015 PROCEDURE:   Colonoscopy, screening and Colonoscopy with snare polypectomy First Screening Colonoscopy - Avg.  risk and is 50 yrs.  old or older Yes.  Prior Negative Screening - Now for repeat screening. N/A  History of Adenoma - Now for follow-up colonoscopy & has been > or = to 3 yrs.  N/A  Polyps removed today? Yes ASA CLASS:   Class II INDICATIONS:Screening for colonic neoplasia and Colorectal Neoplasm Risk Assessment for this procedure is average risk. MEDICATIONS: Monitored anesthesia care and Propofol 250 mg IV  DESCRIPTION OF PROCEDURE:   After the risks benefits and alternatives of the procedure were thoroughly explained, informed consent was obtained.  The digital rectal exam revealed no abnormalities of the rectum.   The LB [id number]  endoscope was introduced through the anus and advanced to the cecum, which was identified by both the appendix and ileocecal valve. No adverse events experienced.   The quality of the prep was excellent.  The instrument was then slowly withdrawn as the colon was fully examined. Estimated blood loss is zero unless otherwise noted in this procedure report.    COLON FINDINGS: Two sessile polyps ranging between 5-45mm in size were found in the descending colon and ascending colon. Polypectomies were performed with a cold snare.  The resection was complete, the polyp tissue was completely retrieved and sent to histology.   The examination was otherwise normal.  Retroflexed views revealed no abnormalities. The time to cecum = 2.2 Withdrawal time = 10.7   The scope was withdrawn and the procedure completed. COMPLICATIONS: There were no immediate  complications.  ENDOSCOPIC IMPRESSION: 1. Two sessile polyps ranging between 5-27mm in size were found in the descending colon and ascending colon; polypectomies were performed with a cold snare 2.   The examination was otherwise normal  RECOMMENDATIONS: If the polyp(s) removed today are proven to be adenomatous (pre-cancerous) polyps, you will need a repeat colonoscopy in 5 years.  Otherwise you should continue to follow colorectal cancer screening guidelines for "routine risk" patients with colonoscopy in 10 years.  You will receive a letter within 1-2 weeks with the results of your biopsy as well as final recommendations.  Please call my office if you have not received a letter after 3 weeks.  eSigned:  Milus Banister, MD 07/17/2015 10:49 AM

## 2015-07-17 NOTE — Progress Notes (Signed)
Called to room to assist during endoscopic procedure.  Patient ID and intended procedure confirmed with present staff. Received instructions for my participation in the procedure from the performing physician.  

## 2015-07-18 ENCOUNTER — Telehealth: Payer: Self-pay | Admitting: *Deleted

## 2015-07-18 NOTE — Telephone Encounter (Signed)
  Follow up Call-  Call back number 07/17/2015  Post procedure Call Back phone  # [phone]  Permission to leave phone message Yes     Patient questions:  Do you have a fever, pain , or abdominal swelling? No. Pain Score  0 *  Have you tolerated food without any problems? Yes.    Have you been able to return to your normal activities? Yes.    Do you have any questions about your discharge instructions: Diet   No. Medications  No. Follow up visit  No.  Do you have questions or concerns about your Care? No.  Actions: * If pain score is 4 or above: No action needed, pain <4.

## 2015-07-24 ENCOUNTER — Encounter: Payer: Self-pay | Admitting: Gastroenterology

## 2015-08-16 ENCOUNTER — Emergency Department (HOSPITAL_COMMUNITY): Payer: BLUE CROSS/BLUE SHIELD

## 2015-08-16 ENCOUNTER — Emergency Department (HOSPITAL_COMMUNITY)
Admission: EM | Admit: 2015-08-16 | Discharge: 2015-08-16 | Disposition: A | Discharge status: Home / Self Care | Payer: BLUE CROSS/BLUE SHIELD | Attending: Emergency Medicine | Admitting: Emergency Medicine

## 2015-08-16 ENCOUNTER — Encounter (HOSPITAL_COMMUNITY): Payer: Self-pay | Admitting: *Deleted

## 2015-08-16 DIAGNOSIS — I10 Essential (primary) hypertension: Secondary | ICD-10-CM | POA: Diagnosis not present

## 2015-08-16 DIAGNOSIS — R55 Syncope and collapse: Secondary | ICD-10-CM | POA: Diagnosis present

## 2015-08-16 DIAGNOSIS — Z79899 Other long term (current) drug therapy: Secondary | ICD-10-CM | POA: Diagnosis not present

## 2015-08-16 DIAGNOSIS — Z8701 Personal history of pneumonia (recurrent): Secondary | ICD-10-CM | POA: Diagnosis not present

## 2015-08-16 DIAGNOSIS — E119 Type 2 diabetes mellitus without complications: Secondary | ICD-10-CM | POA: Diagnosis not present

## 2015-08-16 LAB — BASIC METABOLIC PANEL
Anion gap: 14 (ref 5–15)
BUN: 14 mg/dL (ref 6–20)
CO2: 24 mmol/L (ref 22–32)
Calcium: 9 mg/dL (ref 8.9–10.3)
Chloride: 105 mmol/L (ref 101–111)
Creatinine, Ser: 1.2 mg/dL (ref 0.61–1.24)
GFR calc Af Amer: >60 mL/min (ref >60)
GFR calc non Af Amer: >60 mL/min (ref >60)
Glucose, Bld: 169 mg/dL — ABNORMAL HIGH (ref 65–99)
Potassium: 4.2 mmol/L (ref 3.5–5.1)
Sodium: 143 mmol/L (ref 135–145)

## 2015-08-16 LAB — CBC
HCT: 48.7 % (ref 39.0–52.0)
Hemoglobin: 16.3 g/dL (ref 13.0–17.0)
MCH: 26.7 pg (ref 26.0–34.0)
MCHC: 33.5 g/dL (ref 30.0–36.0)
MCV: 79.8 fL (ref 78.0–100.0)
Platelets: 237 10*3/uL (ref 150–400)
RBC: 6.1 MIL/uL — ABNORMAL HIGH (ref 4.22–5.81)
RDW: 14.6 % (ref 11.5–15.5)
WBC: 10.2 10*3/uL (ref 4.0–10.5)

## 2015-08-16 LAB — I-STAT TROPONIN, ED: Troponin i, poc: 0 ng/mL (ref 0.00–0.08)

## 2015-08-16 LAB — I-STAT CHEM 8, ED
BUN: 17 mg/dL (ref 6–20)
Calcium, Ion: 1.12 mmol/L (ref 1.12–1.23)
Chloride: 105 mmol/L (ref 101–111)
Creatinine, Ser: 1 mg/dL (ref 0.61–1.24)
Glucose, Bld: 166 mg/dL — ABNORMAL HIGH (ref 65–99)
HCT: 52 % (ref 39.0–52.0)
Hemoglobin: 17.7 g/dL — ABNORMAL HIGH (ref 13.0–17.0)
Potassium: 3.7 mmol/L (ref 3.5–5.1)
Sodium: 143 mmol/L (ref 135–145)
TCO2: 25 mmol/L (ref 0–100)

## 2015-08-16 NOTE — ED Notes (Signed)
Pt to ED by EMS c/o syncopal episode. Pt was sitting at desk, took a drink of tea, got choked then attempted to walk to the kitchen, felt dizzy and passed out. EMS initially heard rhonchi throughout, gave 5mg  albuterol with improvement. Pt has been treated with antibiotics for pneumonia

## 2015-08-16 NOTE — ED Provider Notes (Signed)
CSN: [id number]     Arrival date & time 08/16/15  1959 History   First MD Initiated Contact with Patient 08/16/15 2002     Chief Complaint  Patient presents with  . Near Syncope      HPI Pt to ED by EMS c/o syncopal episode. Pt was sitting at desk, took a drink of tea, got choked then attempted to walk to the kitchen, felt dizzy and passed out. EMS initially heard rhonchi throughout, gave 5mg  albuterol with improvement. Pt has been treated with antibiotics for pneumonia  Past Medical History  Diagnosis Date  . Chest pain     no issues since 2014   . Diabetes mellitus without complication ([place])     diet controlled, no medicines   . Hypertension     off BP medicines x 3 months as of 07-03-15   Past Surgical History  Procedure Laterality Date  . Hand surgery      1990's   Family History  Problem Relation Age of Onset  . Hypertension Father   . Colon cancer Neg Hx   . Colon polyps Neg Hx   . Esophageal cancer Neg Hx   . Rectal cancer Neg Hx   . Stomach cancer Neg Hx    Social History  Substance Use Topics  . Smoking status: Never Smoker   . Smokeless tobacco: Never Used  . Alcohol Use: No    Review of Systems  All other systems reviewed and are negative.     Allergies  Review of patient's allergies indicates no known allergies.  Home Medications   Prior to Admission medications   Medication Sig Start Date End Date Taking? Authorizing Provider  HYDROcodone-homatropine (HYCODAN) 5-1.5 MG/5ML syrup Take 5 mLs by mouth every 4 (four) hours as needed for cough.   Yes Historical Provider, MD  valsartan (DIOVAN) 160 MG tablet TAKE 1 TABLET BY MOUTH DAILY 04/17/15  Yes Midge Minium, MD  tadalafil (CIALIS) 20 MG tablet Take 1 tablet (20 mg total) by mouth daily as needed for erectile dysfunction. 05/15/15   Midge Minium, MD   BP 135/73 mmHg  Pulse 71  Temp(Src) 97.9 F (36.6 C)  Resp 16  Ht 5\' 8"  (1.727 m)  Wt 210 lb (95.255 kg)  BMI 31.94 kg/m2  SpO2  94% Physical Exam  Constitutional: He is oriented to person, place, and time. He appears well-developed and well-nourished. No distress.  HENT:  Head: Normocephalic and atraumatic.  Eyes: Pupils are equal, round, and reactive to light.  Neck: Normal range of motion.  Cardiovascular: Normal rate and intact distal pulses.   Pulmonary/Chest: No respiratory distress.  Abdominal: Normal appearance. He exhibits no distension.  Musculoskeletal: Normal range of motion.  Neurological: He is alert and oriented to person, place, and time. No cranial nerve deficit.  Skin: Skin is warm and dry. No rash noted.  Psychiatric: He has a normal mood and affect. His behavior is normal.  Nursing note and vitals reviewed.   ED Course  Procedures (including critical care time) Results for orders placed or performed during the hospital encounter of [id number]  Basic metabolic panel  Result Value Ref Range   Sodium 143 135 - 145 mmol/L   Potassium 4.2 3.5 - 5.1 mmol/L   Chloride 105 101 - 111 mmol/L   CO2 24 22 - 32 mmol/L   Glucose, Bld 169 (H) 65 - 99 mg/dL   BUN 14 6 - 20 mg/dL   Creatinine, Ser 1.20 0.61 -  1.24 mg/dL   Calcium 9.0 8.9 - 10.3 mg/dL   GFR calc non Af Amer >60 >60 mL/min   GFR calc Af Amer >60 >60 mL/min   Anion gap 14 5 - 15  CBC  Result Value Ref Range   WBC 10.2 4.0 - 10.5 K/uL   RBC 6.10 (H) 4.22 - 5.81 MIL/uL   Hemoglobin 16.3 13.0 - 17.0 g/dL   HCT 48.7 39.0 - 52.0 %   MCV 79.8 78.0 - 100.0 fL   MCH 26.7 26.0 - 34.0 pg   MCHC 33.5 30.0 - 36.0 g/dL   RDW 14.6 11.5 - 15.5 %   Platelets 237 150 - 400 K/uL  I-stat troponin, ED (not at [facility])  Result Value Ref Range   Troponin i, poc 0.00 0.00 - 0.08 ng/mL   Comment 3          I-stat chem 8, ed  Result Value Ref Range   Sodium 143 135 - 145 mmol/L   Potassium 3.7 3.5 - 5.1 mmol/L   Chloride 105 101 - 111 mmol/L   BUN 17 6 - 20 mg/dL   Creatinine, Ser 1.00 0.61 - 1.24 mg/dL   Glucose, Bld 166 (H) 65 - 99 mg/dL    Calcium, Ion 1.12 1.12 - 1.23 mmol/L   TCO2 25 0 - 100 mmol/L   Hemoglobin 17.7 (H) 13.0 - 17.0 g/dL   HCT 52.0 39.0 - 52.0 %   Dg Chest 2 View  08/16/2015  CLINICAL DATA:  Choking while drinking beverage EXAM: CHEST  2 VIEW COMPARISON:  04/26/2013 FINDINGS: Mild cardiac enlargement. There is no pleural effusion or edema. No airspace consolidation identified. Asymmetric elevation of right hemidiaphragm is identified, chronic. IMPRESSION: 1. No acute cardiopulmonary abnormalities. Electronically Signed   By: Kerby Moors M.D.   On: 08/16/2015 20:47   Ct Head Wo Contrast  08/16/2015  CLINICAL DATA:  Syncope.  Dizziness EXAM: CT HEAD WITHOUT CONTRAST TECHNIQUE: Contiguous axial images were obtained from the base of the skull through the vertex without intravenous contrast. COMPARISON:  10/11/2008 FINDINGS: No acute cortical infarct, hemorrhage, or mass lesion ispresent. Ventricles are of normal size. No significant extra-axial fluid collection is present. The paranasal sinuses andmastoid air cells are clear. The osseous skull is intact. IMPRESSION: No acute intracranial abnormalities. Electronically Signed   By: Kerby Moors M.D.   On: 08/16/2015 20:42        EKG Interpretation   Date/Time:  Thursday August 16 2015 20:00:26 EST Ventricular Rate:  96 PR Interval:  142 QRS Duration: 83 QT Interval:  366 QTC Calculation: 462 R Axis:   99 Text Interpretation:  Sinus rhythm Probable left atrial enlargement  Borderline right axis deviation Abnormal R-wave progression, late  transition Borderline repolarization abnormality Borderline ST elevation,  lateral leads Abnormal ekg Confirmed by Audie Pinto  MD, Lanora Reveron ([id number]) on  08/16/2015 8:04:53 PM      MDM   Final diagnoses:  Syncope, vasovagal        Leonard Schwartz, MD 08/19/15 [phone]

## 2015-08-16 NOTE — Discharge Instructions (Signed)
Syncope, commonly known as fainting, is a temporary loss of consciousness. It occurs when the blood flow to the brain is reduced. Vasovagal syncope (also called neurocardiogenic syncope) is a fainting spell in which the blood flow to the brain is reduced because of a sudden drop in heart rate and blood pressure. Vasovagal syncope occurs when the brain and the cardiovascular system (blood vessels) do not adequately communicate and respond to each other. This is the most common cause of fainting. It often occurs in response to fear or some other type of emotional or physical stress. The body has a reaction in which the heart starts beating too slowly or the blood vessels expand, reducing blood pressure. This type of fainting spell is generally considered harmless. However, injuries can occur if a person takes a sudden fall during a fainting spell.   CAUSES   Vasovagal syncope occurs when a person's blood pressure and heart rate decrease suddenly, usually in response to a trigger. Many things and situations can trigger an episode. Some of these include:    Pain.    Fear.    The sight of blood or medical procedures, such as blood being drawn from a vein.    Common activities, such as coughing, swallowing, stretching, or going to the bathroom.    Emotional stress.    Prolonged standing, especially in a warm environment.    Lack of sleep or rest.    Prolonged lack of food.    Prolonged lack of fluids.    Recent illness.   The use of certain drugs that affect blood pressure, such as cocaine, alcohol, marijuana, inhalants, and opiates.   SYMPTOMS   Before the fainting episode, you may:    Feel dizzy or light headed.    Become pale.   Sense that you are going to faint.    Feel like the room is spinning.    Have tunnel vision, only seeing directly in front of you.    Feel sick to your stomach (nauseous).    See spots or slowly lose vision.    Hear ringing in your ears.    Have a headache.     Feel warm and sweaty.    Feel a sensation of pins and needles.  During the fainting spell, you will generally be unconscious for no longer than a couple minutes before waking up and returning to normal. If you get up too quickly before your body can recover, you may faint again. Some twitching or jerky movements may occur during the fainting spell.   DIAGNOSIS   Your health care provider will ask about your symptoms, take a medical history, and perform a physical exam. Various tests may be done to rule out other causes of fainting. These may include blood tests and tests to check the heart, such as electrocardiography, echocardiography, and possibly an electrophysiology study. When other causes have been ruled out, a test may be done to check the body's response to changes in position (tilt table test).  TREATMENT   Most cases of vasovagal syncope do not require treatment. Your health care provider may recommend ways to avoid fainting triggers and may provide home strategies for preventing fainting. If you must be exposed to a possible trigger, you can drink additional fluids to help reduce your chances of having an episode of vasovagal syncope. If you have warning signs of an oncoming episode, you can respond by positioning yourself favorably (lying down).  If your fainting spells continue, you may be   given medicines to prevent fainting. Some medicines may help make you more resistant to repeated episodes of vasovagal syncope. Special exercises or compression stockings may be recommended. In rare cases, the surgical placement of a pacemaker is considered.  HOME CARE INSTRUCTIONS    Learn to identify the warning signs of vasovagal syncope.    Sit or lie down at the first warning sign of a fainting spell. If sitting, put your head down between your legs. If you lie down, swing your legs up in the air to increase blood flow to the brain.    Avoid hot tubs and saunas.   Avoid prolonged standing.   Drink  enough fluids to keep your urine clear or pale yellow. Avoid caffeine.   Increase salt in your diet as directed by your health care provider.    If you have to stand for a long time, perform movements such as:     Crossing your legs.     Flexing and stretching your leg muscles.     Squatting.     Moving your legs.     Bending over.    Only take over-the-counter or prescription medicines as directed by your health care provider. Do not suddenly stop any medicines without asking your health care provider first.  SEEK MEDICAL CARE IF:    Your fainting spells continue or happen more frequently in spite of treatment.    You lose consciousness for more than a couple minutes.   You have fainting spells during or after exercising or after being startled.    You have new symptoms that occur with the fainting spells, such as:     Shortness of breath.    Chest pain.     Irregular heartbeat.    You have episodes of twitching or jerky movements that last longer than a few seconds.   You have episodes of twitching or jerky movements without obvious fainting.  SEEK IMMEDIATE MEDICAL CARE IF:    You have injuries or bleeding after a fainting spell.    You have episodes of twitching or jerky movements that last longer than 5 minutes.    You have more than one spell of twitching or jerky movements before returning to consciousness after fainting.     This information is not intended to replace advice given to you by your health care provider. Make sure you discuss any questions you have with your health care provider.     Document Released: 06/16/2012 Document Revised: 11/14/2014 Document Reviewed: 06/16/2012  Elsevier Interactive Patient Education 2016 [employer]

## 2015-08-29 ENCOUNTER — Encounter: Payer: Self-pay | Admitting: Family Medicine

## 2015-08-29 ENCOUNTER — Ambulatory Visit (INDEPENDENT_AMBULATORY_CARE_PROVIDER_SITE_OTHER): Payer: BLUE CROSS/BLUE SHIELD | Admitting: Family Medicine

## 2015-08-29 VITALS — BP 134/100 | HR 87 | Temp 98.3°F | Ht 69.0 in | Wt 216.6 lb

## 2015-08-29 DIAGNOSIS — E119 Type 2 diabetes mellitus without complications: Secondary | ICD-10-CM | POA: Diagnosis not present

## 2015-08-29 DIAGNOSIS — I10 Essential (primary) hypertension: Secondary | ICD-10-CM | POA: Diagnosis not present

## 2015-08-29 DIAGNOSIS — J9801 Acute bronchospasm: Secondary | ICD-10-CM | POA: Diagnosis not present

## 2015-08-29 LAB — CBC WITH DIFFERENTIAL/PLATELET
Basophils Absolute: 0 10*3/uL (ref 0.0–0.1)
Basophils Relative: 0.5 % (ref 0.0–3.0)
Eosinophils Absolute: 0.5 10*3/uL (ref 0.0–0.7)
Eosinophils Relative: 5.3 % — ABNORMAL HIGH (ref 0.0–5.0)
HCT: 49.2 % (ref 39.0–52.0)
Hemoglobin: 16.1 g/dL (ref 13.0–17.0)
Lymphocytes Relative: 32.3 % (ref 12.0–46.0)
Lymphs Abs: 2.9 10*3/uL (ref 0.7–4.0)
MCHC: 32.7 g/dL (ref 30.0–36.0)
MCV: 78.8 fl (ref 78.0–100.0)
Monocytes Absolute: 0.8 10*3/uL (ref 0.1–1.0)
Monocytes Relative: 9.1 % (ref 3.0–12.0)
Neutro Abs: 4.7 10*3/uL (ref 1.4–7.7)
Neutrophils Relative %: 52.8 % (ref 43.0–77.0)
Platelets: 239 10*3/uL (ref 150.0–400.0)
RBC: 6.24 Mil/uL — ABNORMAL HIGH (ref 4.22–5.81)
RDW: 14.6 % (ref 11.5–15.5)
WBC: 9 10*3/uL (ref 4.0–10.5)

## 2015-08-29 LAB — BASIC METABOLIC PANEL
BUN: 19 mg/dL (ref 6–23)
CO2: 29 mEq/L (ref 19–32)
Calcium: 9.5 mg/dL (ref 8.4–10.5)
Chloride: 103 mEq/L (ref 96–112)
Creatinine, Ser: 1.08 mg/dL (ref 0.40–1.50)
GFR: 92.2 mL/min (ref >60.00)
Glucose, Bld: 134 mg/dL — ABNORMAL HIGH (ref 70–99)
Potassium: 3.8 mEq/L (ref 3.5–5.1)
Sodium: 140 mEq/L (ref 135–145)

## 2015-08-29 LAB — HEMOGLOBIN A1C: Hgb A1c MFr Bld: 7.1 % — ABNORMAL HIGH (ref 4.6–6.5)

## 2015-08-29 MED ORDER — VALSARTAN-HYDROCHLOROTHIAZIDE 160-12.5 MG PO TABS: 1.0000 | ORAL_TABLET | Freq: Every day | ORAL | Status: DC

## 2015-08-29 MED ORDER — ALBUTEROL SULFATE HFA 108 (90 BASE) MCG/ACT IN AERS: 2.0000 | INHALATION_SPRAY | Freq: Four times a day (QID) | RESPIRATORY_TRACT | Status: DC | PRN

## 2015-08-29 NOTE — Assessment & Plan Note (Signed)
New.  Pt's recent CXR showed clearing of PNA.  Cough improved s/p neb tx in office.  Pt to use albuterol prn and cough meds as needed.  Reviewed supportive care and red flags that should prompt return.  Pt expressed understanding and is in agreement w/ plan.

## 2015-08-29 NOTE — Assessment & Plan Note (Signed)
Diet controlled.  On ARB for renal protection.  Due for eye exam.  Pt has lost 7 lbs.  Check labs.  Adjust tx plan prn.

## 2015-08-29 NOTE — Assessment & Plan Note (Signed)
Deteriorated.  BP is not at goal.  Based on this, will start Valsartan HCTZ as pt's diastolic BP is the one out of range.  Check labs.  Reviewed dietary and lifestyle modifications that will improve BP.  Will follow closely.

## 2015-08-29 NOTE — Progress Notes (Signed)
Pre visit review using our clinic review tool, if applicable. No additional management support is needed unless otherwise documented below in the visit note. 

## 2015-08-29 NOTE — Progress Notes (Signed)
   Subjective:    Patient ID: Lee Greene, male    DOB: 19-Jul-1960, 56 y.o.   MRN: [id number]  HPI  ER F/U- pt went to ER on 2/2 for vasovagal syncope in setting of PNA.  Had normal troponins.  BP was elevated.  Continue to have productive cough.  HTN- chronic problem, BP has been elevated since getting dx'd w/ PNA on 1/22.  Pt has been going to UC off [facility] and they started him on BP meds (valsartan) on 1/23.  Wanted to change BP meds on 2/12 to Valsartan HCTZ daily.  Pt has not started yet.  Denies CP, SOB, HAs, visual changes, edema.  BP last night was 154/91.   DM- diet controlled.  Due for eye exam.  Currently asymptomatic.  Has lost 7 lbs.  On ARB for renal protection.   Review of Systems For ROS see HPI     Objective:   Physical Exam  Constitutional: He is oriented to person, place, and time. He appears well-developed and well-nourished. No distress.  HENT:  Head: Normocephalic and atraumatic.  Right Ear: Tympanic membrane normal.  Left Ear: Tympanic membrane normal.  Nose: No mucosal edema or rhinorrhea. Right sinus exhibits no maxillary sinus tenderness and no frontal sinus tenderness. Left sinus exhibits no maxillary sinus tenderness and no frontal sinus tenderness.  Mouth/Throat: Mucous membranes are normal. No oropharyngeal exudate, posterior oropharyngeal edema or posterior oropharyngeal erythema.  Eyes: Conjunctivae and EOM are normal. Pupils are equal, round, and reactive to light.  Neck: Normal range of motion. Neck supple.  Cardiovascular: Normal rate, regular rhythm and normal heart sounds.   Pulmonary/Chest: Effort normal and breath sounds normal. No respiratory distress. He has no wheezes.  + hacking cough- improved s/p neb tx in office, as did air movement  Lymphadenopathy:    He has no cervical adenopathy.  Neurological: He is alert and oriented to person, place, and time.  Skin: Skin is warm and dry.  Vitals reviewed.         Assessment &  Plan:

## 2015-08-29 NOTE — Patient Instructions (Signed)
Follow up in 1 month to recheck BP We'll notify you of your lab results and make any changes if needed START the Valsartan HCTZ combination pill once daily Drink plenty of water Limit your salt intake- this will improve your blood pressure Continue to get regular exercise Use the albuterol inhaler- 2 puffs every 4 hrs as needed for cough or shortness of breath Mucinex DM for daytime cough and chest congestion Call with any questions or concerns Hang in there!!!

## 2015-08-30 ENCOUNTER — Other Ambulatory Visit: Payer: Self-pay | Admitting: General Practice

## 2015-08-30 MED ORDER — METFORMIN HCL 500 MG PO TABS: 500.0000 mg | ORAL_TABLET | Freq: Two times a day (BID) | ORAL | Status: DC

## 2015-08-30 MED ORDER — ALBUTEROL SULFATE (2.5 MG/3ML) 0.083% IN NEBU: 2.5000 mg | INHALATION_SOLUTION | Freq: Four times a day (QID) | RESPIRATORY_TRACT | Status: DC | PRN

## 2015-08-30 NOTE — Addendum Note (Signed)
Addended by: Bunnie Domino on: 08/30/2015 01:13 PM   Modules accepted: Orders

## 2015-08-30 NOTE — Addendum Note (Signed)
Addended by: Vernie Shanks E on: 08/30/2015 01:07 PM   Modules accepted: Orders

## 2015-09-03 ENCOUNTER — Telehealth: Payer: Self-pay | Admitting: Family Medicine

## 2015-09-03 NOTE — Telephone Encounter (Signed)
Caller name: Brock   Relationship to patient: Self  Can be reached: [phone]  Reason for call: pt has questions about his metFORMIN Rx. Pt says that it was making him sick and causing him to have diarrhea. Pt says that he stop taking it. Pt would like to know if PCP could prescribe something else for him.

## 2015-09-03 NOTE — Telephone Encounter (Signed)
The medication can cause diarrhea but typically the body adjusts after the first week.  If having diarrhea for longer than the 1st 7 days, we typically change the medication.  Please ask him how long he was taking med before stopping it

## 2015-09-04 NOTE — Telephone Encounter (Signed)
Patient states he only took medication for 2 days.,states he will give it more time. Advised to call back if problem continues after then

## 2015-09-05 MED ORDER — ALBUTEROL SULFATE (2.5 MG/3ML) 0.083% IN NEBU
2.5000 mg | INHALATION_SOLUTION | Freq: Once | RESPIRATORY_TRACT | Status: AC
  Administered 2015-08-29: 2.5 mg via RESPIRATORY_TRACT

## 2015-09-05 NOTE — Addendum Note (Signed)
Addended by: Bunnie Domino on: 09/05/2015 01:50 PM   Modules accepted: Orders

## 2015-09-26 ENCOUNTER — Encounter: Payer: Self-pay | Admitting: Family Medicine

## 2015-09-26 ENCOUNTER — Ambulatory Visit (INDEPENDENT_AMBULATORY_CARE_PROVIDER_SITE_OTHER): Payer: BLUE CROSS/BLUE SHIELD | Admitting: Family Medicine

## 2015-09-26 ENCOUNTER — Encounter: Payer: Self-pay | Admitting: General Practice

## 2015-09-26 VITALS — BP 130/88 | HR 76 | Temp 98.1°F | Resp 16 | Ht 69.0 in | Wt 211.2 lb

## 2015-09-26 DIAGNOSIS — I10 Essential (primary) hypertension: Secondary | ICD-10-CM | POA: Diagnosis not present

## 2015-09-26 LAB — BASIC METABOLIC PANEL
BUN: 20 mg/dL (ref 6–23)
CO2: 31 mEq/L (ref 19–32)
Calcium: 9.7 mg/dL (ref 8.4–10.5)
Chloride: 102 mEq/L (ref 96–112)
Creatinine, Ser: 1.2 mg/dL (ref 0.40–1.50)
GFR: 81.62 mL/min (ref >60.00)
Glucose, Bld: 133 mg/dL — ABNORMAL HIGH (ref 70–99)
Potassium: 3.6 mEq/L (ref 3.5–5.1)
Sodium: 141 mEq/L (ref 135–145)

## 2015-09-26 NOTE — Progress Notes (Signed)
   Subjective:    Patient ID: Lee Greene, male    DOB: June 23, 1961, 55 y.o.   MRN: [id number]  HPI HTN- chronic problem.  BP was not well controlled at last visit so HCTZ was added to [place].  BP is much better today.  Denies CP, SOB, HAs, visual changes, edema, dizzy.  Some increased urination.  Pt has lost 6 lbs since last visit.  Increased exercise, improved diet.     Review of Systems For ROS see HPI     Objective:   Physical Exam  Constitutional: He is oriented to person, place, and time. He appears well-developed and well-nourished. No distress.  HENT:  Head: Normocephalic and atraumatic.  Eyes: Conjunctivae and EOM are normal. Pupils are equal, round, and reactive to light.  Neck: Normal range of motion. Neck supple. No thyromegaly present.  Cardiovascular: Normal rate, regular rhythm, normal heart sounds and intact distal pulses.   No murmur heard. Pulmonary/Chest: Effort normal and breath sounds normal. No respiratory distress.  Abdominal: Soft. Bowel sounds are normal. He exhibits no distension.  Musculoskeletal: He exhibits no edema.  Lymphadenopathy:    He has no cervical adenopathy.  Neurological: He is alert and oriented to person, place, and time. No cranial nerve deficit.  Skin: Skin is warm and dry.  Psychiatric: He has a normal mood and affect. His behavior is normal.  Vitals reviewed.         Assessment & Plan:

## 2015-09-26 NOTE — Progress Notes (Signed)
Pre visit review using our clinic review tool, if applicable. No additional management support is needed unless otherwise documented below in the visit note. 

## 2015-09-26 NOTE — Assessment & Plan Note (Signed)
Chronic problem.  Better control since starting HCTZ.  Applauded pt's recent weight loss efforts.  Check BMP.  No anticipated med changes.

## 2015-09-26 NOTE — Patient Instructions (Signed)
Follow up in May or June to recheck sugar We'll notify you of your lab results and make any changes if needed Keep up the good work on healthy diet and regular exercise- you look great!!! No med changes at this time Call with any questions or concerns If you want to join [place] at the new [place] office, any scheduled appointments will automatically transfer and we will see you at [address] [place] [address] [place], [place] [address] (OPENING 3/23) [place]!!!

## 2015-11-28 ENCOUNTER — Ambulatory Visit: Payer: BLUE CROSS/BLUE SHIELD | Admitting: Family Medicine

## 2016-01-13 ENCOUNTER — Other Ambulatory Visit: Payer: Self-pay | Admitting: Family Medicine

## 2016-01-16 NOTE — Telephone Encounter (Signed)
Medication filled to pharmacy as requested.   

## 2016-09-08 ENCOUNTER — Other Ambulatory Visit: Payer: Self-pay | Admitting: Family Medicine

## 2016-09-08 ENCOUNTER — Telehealth: Payer: Self-pay | Admitting: Family Medicine

## 2016-09-08 NOTE — Telephone Encounter (Signed)
Disregard message

## 2016-09-09 ENCOUNTER — Encounter: Payer: Self-pay | Admitting: Family Medicine

## 2016-09-09 ENCOUNTER — Ambulatory Visit (INDEPENDENT_AMBULATORY_CARE_PROVIDER_SITE_OTHER): Payer: BLUE CROSS/BLUE SHIELD | Admitting: Family Medicine

## 2016-09-09 VITALS — BP 156/88 | HR 81 | Temp 98.1°F | Resp 16 | Ht 69.0 in | Wt 216.4 lb

## 2016-09-09 DIAGNOSIS — I10 Essential (primary) hypertension: Secondary | ICD-10-CM | POA: Diagnosis not present

## 2016-09-09 DIAGNOSIS — E119 Type 2 diabetes mellitus without complications: Secondary | ICD-10-CM | POA: Diagnosis not present

## 2016-09-09 LAB — HEPATIC FUNCTION PANEL
ALT: 31 U/L (ref 0–53)
AST: 19 U/L (ref 0–37)
Albumin: 4.5 g/dL (ref 3.5–5.2)
Alkaline Phosphatase: 65 U/L (ref 39–117)
Bilirubin, Direct: 0.2 mg/dL (ref 0.0–0.3)
Total Bilirubin: 1.1 mg/dL (ref 0.2–1.2)
Total Protein: 7.1 g/dL (ref 6.0–8.3)

## 2016-09-09 LAB — CBC WITH DIFFERENTIAL/PLATELET
Basophils Absolute: 0 10*3/uL (ref 0.0–0.1)
Basophils Relative: 0.5 % (ref 0.0–3.0)
Eosinophils Absolute: 0.1 10*3/uL (ref 0.0–0.7)
Eosinophils Relative: 1.8 % (ref 0.0–5.0)
HCT: 50.2 % (ref 39.0–52.0)
Hemoglobin: 16.7 g/dL (ref 13.0–17.0)
Lymphocytes Relative: 34 % (ref 12.0–46.0)
Lymphs Abs: 2.7 10*3/uL (ref 0.7–4.0)
MCHC: 33.2 g/dL (ref 30.0–36.0)
MCV: 79.8 fl (ref 78.0–100.0)
Monocytes Absolute: 0.6 10*3/uL (ref 0.1–1.0)
Monocytes Relative: 8.3 % (ref 3.0–12.0)
Neutro Abs: 4.3 10*3/uL (ref 1.4–7.7)
Neutrophils Relative %: 55.4 % (ref 43.0–77.0)
Platelets: 265 10*3/uL (ref 150.0–400.0)
RBC: 6.3 Mil/uL — ABNORMAL HIGH (ref 4.22–5.81)
RDW: 14.7 % (ref 11.5–15.5)
WBC: 7.8 10*3/uL (ref 4.0–10.5)

## 2016-09-09 LAB — BASIC METABOLIC PANEL
BUN: 13 mg/dL (ref 6–23)
CO2: 30 mEq/L (ref 19–32)
Calcium: 9.6 mg/dL (ref 8.4–10.5)
Chloride: 107 mEq/L (ref 96–112)
Creatinine, Ser: 1.12 mg/dL (ref 0.40–1.50)
GFR: 88.07 mL/min (ref >60.00)
Glucose, Bld: 115 mg/dL — ABNORMAL HIGH (ref 70–99)
Potassium: 4.1 mEq/L (ref 3.5–5.1)
Sodium: 144 mEq/L (ref 135–145)

## 2016-09-09 LAB — LIPID PANEL
Cholesterol: 136 mg/dL (ref 0–200)
HDL: 67.2 mg/dL (ref >39.00)
LDL Cholesterol: 54 mg/dL (ref 0–99)
NonHDL: 68.67
Total CHOL/HDL Ratio: 2
Triglycerides: 72 mg/dL (ref 0.0–149.0)
VLDL: 14.4 mg/dL (ref 0.0–40.0)

## 2016-09-09 LAB — TSH: TSH: 1.3 u[IU]/mL (ref 0.35–4.50)

## 2016-09-09 LAB — HEMOGLOBIN A1C: Hgb A1c MFr Bld: 7.1 % — ABNORMAL HIGH (ref 4.6–6.5)

## 2016-09-09 MED ORDER — VALSARTAN-HYDROCHLOROTHIAZIDE 160-25 MG PO TABS: ORAL_TABLET | ORAL | 3 refills | Status: DC

## 2016-09-09 NOTE — Patient Instructions (Addendum)
Follow up in 3-4 months to recheck diabetes We'll notify you of your lab results and make any changes if needed RESTART the Valsartan-HCTZ daily for better BP control STOP the Metformin.  Once we see your labs, we'll determine a better medication for you Continue to work on healthy diet and regular exercise- you can do it!!! Call and schedule your eye exam and have them send me the report Call with any questions or concerns [place]!!!

## 2016-09-09 NOTE — Progress Notes (Signed)
   Subjective:    Patient ID: Lee Greene, male    DOB: [birth year], 56 y.o.   MRN: [id number]  HPI DM- chronic problem, on Metformin twice daily.  Last A1C 7.1  On ARB for renal protection but has been out of meds x1 week.  Pt has gained 5 lbs since last visit.  Due for eye exam, foot exam.  Continues to have diarrhea and GI upset w/ Metformin so has not been taking regularly.  Denies symptomatic lows.  No numbness/tingling of hands/feet.  Pt is not exercising.  HTN- chronic problem.  BP is not well controlled today b/c he's been out of meds x1 week.  No CP, SOB, HAs, visual changes, edema.   Review of Systems For ROS see HPI     Objective:   Physical Exam  Constitutional: He is oriented to person, place, and time. He appears well-developed and well-nourished. No distress.  obese  HENT:  Head: Normocephalic and atraumatic.  Eyes: Conjunctivae and EOM are normal. Pupils are equal, round, and reactive to light.  Neck: Normal range of motion. Neck supple. No thyromegaly present.  Cardiovascular: Normal rate, regular rhythm, normal heart sounds and intact distal pulses.   No murmur heard. Pulmonary/Chest: Effort normal and breath sounds normal. No respiratory distress.  Abdominal: Soft. Bowel sounds are normal. He exhibits no distension.  Musculoskeletal: He exhibits no edema.  Lymphadenopathy:    He has no cervical adenopathy.  Neurological: He is alert and oriented to person, place, and time. No cranial nerve deficit.  Skin: Skin is warm and dry.  Psychiatric: He has a normal mood and affect. His behavior is normal.  Vitals reviewed.  Diabetic Foot Exam - Simple   Simple Foot Form Diabetic Foot exam was performed with the following findings:  Yes 09/09/2016 12:04 PM  Visual Inspection No deformities, no ulcerations, no other skin breakdown bilaterally:  Yes Sensation Testing Intact to touch and monofilament testing bilaterally:  Yes Pulse Check Posterior Tibialis and  Dorsalis pulse intact bilaterally:  Yes Comments           Assessment & Plan:

## 2016-09-09 NOTE — Progress Notes (Signed)
Pre visit review using our clinic review tool, if applicable. No additional management support is needed unless otherwise documented below in the visit note. 

## 2016-09-10 ENCOUNTER — Encounter: Payer: Self-pay | Admitting: General Practice

## 2016-09-10 NOTE — Assessment & Plan Note (Signed)
Chronic problem.  Pt has not been compliant in his follow ups.  Due for eye exam- pt plans to schedule.  Foot exam done today.  On ARB for renal protection.  Stressed need for healthy diet and regular exercise.  Stop metformin due to GI side effects.  Will follow closely.

## 2016-09-10 NOTE — Assessment & Plan Note (Signed)
Deteriorated.  Pt has been out of meds x1 week.  Asymptomatic.  Stressed need for healthy diet, regular exercise, and scheduled followups.  Check labs.  Restart meds.  Will follow closely.

## 2016-09-19 DIAGNOSIS — H0015 Chalazion left lower eyelid: Secondary | ICD-10-CM | POA: Diagnosis not present

## 2016-09-25 ENCOUNTER — Encounter: Payer: Self-pay | Admitting: Family Medicine

## 2016-09-25 ENCOUNTER — Ambulatory Visit (INDEPENDENT_AMBULATORY_CARE_PROVIDER_SITE_OTHER): Payer: BLUE CROSS/BLUE SHIELD | Admitting: Family Medicine

## 2016-09-25 VITALS — BP 131/82 | HR 69 | Temp 97.9°F | Resp 16 | Ht 69.0 in | Wt 217.5 lb

## 2016-09-25 DIAGNOSIS — H00012 Hordeolum externum right lower eyelid: Secondary | ICD-10-CM | POA: Diagnosis not present

## 2016-09-25 NOTE — Progress Notes (Signed)
   Subjective:    Patient ID: Lee Greene, male    DOB: 12-14-1960, 56 y.o.   MRN: [id number]  HPI Eye infxn- pt went to [facility] on 3/9 and dx'd w/ R eye infxn.  Started on Tobramycin.  Pt had lower lid swelling and redness.  + pain.  Pt was told in 2 days sxs 'would be gone'.  Area looks better but remains mildly tender to touch.  No drainage   Review of Systems For ROS see HPI     Objective:   Physical Exam  Constitutional: He is oriented to person, place, and time. He appears well-developed and well-nourished. No distress.  HENT:  Head: Normocephalic and atraumatic.  Eyes: Conjunctivae and EOM are normal. Pupils are equal, round, and reactive to light. Right eye exhibits no discharge. Left eye exhibits no discharge.  R lower lid stye laterally, mildly TTP, no evidence of infection  Neurological: He is alert and oriented to person, place, and time.  Psychiatric: He has a normal mood and affect. His behavior is normal. Thought content normal.  Vitals reviewed.         Assessment & Plan:  Stye- new.  No evidence of infxn at this time so pt to stop tobradex.  Reviewed dx.  Reviewed supportive care and red flags that should prompt return.  Pt expressed understanding and is in agreement w/ plan.

## 2016-09-25 NOTE — Patient Instructions (Signed)
Follow up as needed/scheduled This is a stye and should improve w/ time Apply warm compresses to the area to help speed healing There is no need to continue the antibiotic eye drops Call with any questions or concerns Happy Spring!!!

## 2016-09-25 NOTE — Progress Notes (Signed)
Pre visit review using our clinic review tool, if applicable. No additional management support is needed unless otherwise documented below in the visit note. 

## 2017-01-30 ENCOUNTER — Telehealth: Payer: Self-pay | Admitting: Family Medicine

## 2017-01-30 NOTE — Telephone Encounter (Signed)
Pt states that he heard about recall on TV regarding losartan/valsartan and asking if he should be concerned, please advise.

## 2017-01-30 NOTE — Telephone Encounter (Signed)
Called pt and left a detailed message to inform that he should contact his pharmacy as they can inform him if his medication is affected.

## 2017-02-11 ENCOUNTER — Telehealth: Payer: Self-pay | Admitting: Family Medicine

## 2017-02-11 MED ORDER — LOSARTAN POTASSIUM-HCTZ 100-25 MG PO TABS: 1.0000 | ORAL_TABLET | Freq: Every day | ORAL | 1 refills | Status: DC

## 2017-02-11 NOTE — Telephone Encounter (Signed)
Please advise 

## 2017-02-11 NOTE — Telephone Encounter (Signed)
Patient states he is currently at pharmacy to get refill of valsartan-hydrochlorothiazide (DIOVAN-HCT) 160-25 MG tablet.  However, pharmacy states he is out of refills.  Patient wants to know how many refills he was given and he will need a refill sent to pharmacy:  Pharmacy:  [facility] [place], [address] [place] [phone] (Phone) [phone] (Fax)

## 2017-02-11 NOTE — Telephone Encounter (Signed)
Called and informed patient. New rx sent to the pharmacy and pt scheduled a follow up with Dr. Birdie Riddle on 03/20/17 @ 8:45.

## 2017-02-11 NOTE — Telephone Encounter (Signed)
Since we are having issues w/ Valsartan we can change med to Losartan HCTZ 100/25mg  daily, #30, no refills.  Overdue for diabetes follow up.

## 2017-03-20 ENCOUNTER — Ambulatory Visit (INDEPENDENT_AMBULATORY_CARE_PROVIDER_SITE_OTHER): Payer: BLUE CROSS/BLUE SHIELD | Admitting: Family Medicine

## 2017-03-20 ENCOUNTER — Other Ambulatory Visit: Payer: Self-pay | Admitting: General Practice

## 2017-03-20 ENCOUNTER — Encounter: Payer: Self-pay | Admitting: Family Medicine

## 2017-03-20 VITALS — BP 126/86 | HR 81 | Temp 98.1°F | Resp 16 | Ht 69.0 in | Wt 222.5 lb

## 2017-03-20 DIAGNOSIS — E119 Type 2 diabetes mellitus without complications: Secondary | ICD-10-CM

## 2017-03-20 DIAGNOSIS — E663 Overweight: Secondary | ICD-10-CM | POA: Insufficient documentation

## 2017-03-20 DIAGNOSIS — I10 Essential (primary) hypertension: Secondary | ICD-10-CM | POA: Diagnosis not present

## 2017-03-20 DIAGNOSIS — E669 Obesity, unspecified: Secondary | ICD-10-CM

## 2017-03-20 LAB — CBC WITH DIFFERENTIAL/PLATELET
Basophils Absolute: 0.1 10*3/uL (ref 0.0–0.1)
Basophils Relative: 0.8 % (ref 0.0–3.0)
Eosinophils Absolute: 0.1 10*3/uL (ref 0.0–0.7)
Eosinophils Relative: 1.9 % (ref 0.0–5.0)
HCT: 50.3 % (ref 39.0–52.0)
Hemoglobin: 16.2 g/dL (ref 13.0–17.0)
Lymphocytes Relative: 37.8 % (ref 12.0–46.0)
Lymphs Abs: 2.8 10*3/uL (ref 0.7–4.0)
MCHC: 32.1 g/dL (ref 30.0–36.0)
MCV: 81 fl (ref 78.0–100.0)
Monocytes Absolute: 0.6 10*3/uL (ref 0.1–1.0)
Monocytes Relative: 8.7 % (ref 3.0–12.0)
Neutro Abs: 3.8 10*3/uL (ref 1.4–7.7)
Neutrophils Relative %: 50.8 % (ref 43.0–77.0)
Platelets: 278 10*3/uL (ref 150.0–400.0)
RBC: 6.22 Mil/uL — ABNORMAL HIGH (ref 4.22–5.81)
RDW: 14.4 % (ref 11.5–15.5)
WBC: 7.4 10*3/uL (ref 4.0–10.5)

## 2017-03-20 LAB — LIPID PANEL
Cholesterol: 133 mg/dL (ref 0–200)
HDL: 60.9 mg/dL (ref >39.00)
LDL Cholesterol: 53 mg/dL (ref 0–99)
NonHDL: 72.47
Total CHOL/HDL Ratio: 2
Triglycerides: 97 mg/dL (ref 0.0–149.0)
VLDL: 19.4 mg/dL (ref 0.0–40.0)

## 2017-03-20 LAB — HEPATIC FUNCTION PANEL
ALT: 43 U/L (ref 0–53)
AST: 22 U/L (ref 0–37)
Albumin: 4.3 g/dL (ref 3.5–5.2)
Alkaline Phosphatase: 67 U/L (ref 39–117)
Bilirubin, Direct: 0.2 mg/dL (ref 0.0–0.3)
Total Bilirubin: 1 mg/dL (ref 0.2–1.2)
Total Protein: 6.8 g/dL (ref 6.0–8.3)

## 2017-03-20 LAB — BASIC METABOLIC PANEL
BUN: 11 mg/dL (ref 6–23)
CO2: 31 mEq/L (ref 19–32)
Calcium: 9.4 mg/dL (ref 8.4–10.5)
Chloride: 104 mEq/L (ref 96–112)
Creatinine, Ser: 1.02 mg/dL (ref 0.40–1.50)
GFR: 97.91 mL/min (ref >60.00)
Glucose, Bld: 123 mg/dL — ABNORMAL HIGH (ref 70–99)
Potassium: 4.1 mEq/L (ref 3.5–5.1)
Sodium: 142 mEq/L (ref 135–145)

## 2017-03-20 LAB — HEMOGLOBIN A1C: Hgb A1c MFr Bld: 7.3 % — ABNORMAL HIGH (ref 4.6–6.5)

## 2017-03-20 LAB — TSH: TSH: 1.22 u[IU]/mL (ref 0.35–4.50)

## 2017-03-20 MED ORDER — METFORMIN HCL 500 MG PO TABS: 500.0000 mg | ORAL_TABLET | Freq: Two times a day (BID) | ORAL | 1 refills | Status: DC

## 2017-03-20 MED ORDER — TADALAFIL 20 MG PO TABS: 20.0000 mg | ORAL_TABLET | Freq: Every day | ORAL | 5 refills | Status: DC | PRN

## 2017-03-20 NOTE — Progress Notes (Signed)
Pre visit review using our clinic review tool, if applicable. No additional management support is needed unless otherwise documented below in the visit note. 

## 2017-03-20 NOTE — Assessment & Plan Note (Signed)
Chronic problem.  Currently diet controlled.  Pt has gained weight.  Overdue on eye exam- scheduled for January.  On ARB for renal protection.  Foot exam done today. Check labs.  Adjust tx plan prn.

## 2017-03-20 NOTE — Assessment & Plan Note (Signed)
Chronic problem.  Well controlled today.  Asymptomatic.  Check labs.  No anticipated med changes.  Will follow. 

## 2017-03-20 NOTE — Progress Notes (Signed)
   Subjective:    Patient ID: Lee Greene, male    DOB: 11/17/1960, 56 y.o.   MRN: [id number]  HPI DM- chronic problem.  Currently diet controlled.  Pt has gained 4 lbs since last visit.  UTD on foot exam, on ARB for renal protection.  Overdue for eye exam- scheduled for January.  Denies symptomatic lows.  No numbness/tingling of hands/feet.  Pt reports he is walking much more at work but no formal exercise.  HTN- chronic problem, on Losartan HCTZ daily w/ good control.  Denies CP, SOB, HAs, visual changes, edema.   Review of Systems For ROS see HPI     Objective:   Physical Exam  Constitutional: He is oriented to person, place, and time. He appears well-developed and well-nourished. No distress.  obese  HENT:  Head: Normocephalic and atraumatic.  Eyes: Pupils are equal, round, and reactive to light. Conjunctivae and EOM are normal.  Neck: Normal range of motion. Neck supple. No thyromegaly present.  Cardiovascular: Normal rate, regular rhythm, normal heart sounds and intact distal pulses.   No murmur heard. Pulmonary/Chest: Effort normal and breath sounds normal. No respiratory distress.  Abdominal: Soft. Bowel sounds are normal. He exhibits no distension.  Musculoskeletal: He exhibits no edema.  Lymphadenopathy:    He has no cervical adenopathy.  Neurological: He is alert and oriented to person, place, and time. No cranial nerve deficit.  Skin: Skin is warm and dry.  Psychiatric: He has a normal mood and affect. His behavior is normal.  Vitals reviewed.         Assessment & Plan:

## 2017-03-20 NOTE — Assessment & Plan Note (Signed)
Ongoing issue for pt.  Stressed need for healthy diet and regular exercise.  Check labs to risk stratify.  Will follow 

## 2017-03-20 NOTE — Patient Instructions (Signed)
Schedule your complete physical in 6 months We'll notify you of your lab results and make any changes if needed Continue to work on healthy diet and regular exercise- you can do it! When you get your eye exam, please have them send me a copy of your report Call with any questions or concerns Happy Fall!!!  Good luck with your season!!

## 2017-11-10 DIAGNOSIS — I10 Essential (primary) hypertension: Secondary | ICD-10-CM | POA: Diagnosis not present

## 2017-11-10 DIAGNOSIS — Z6833 Body mass index (BMI) 33.0-33.9, adult: Secondary | ICD-10-CM | POA: Diagnosis not present

## 2017-11-10 DIAGNOSIS — J209 Acute bronchitis, unspecified: Secondary | ICD-10-CM | POA: Diagnosis not present

## 2018-04-07 ENCOUNTER — Other Ambulatory Visit: Payer: Self-pay

## 2018-04-07 ENCOUNTER — Ambulatory Visit: Payer: BLUE CROSS/BLUE SHIELD | Admitting: Family Medicine

## 2018-04-07 ENCOUNTER — Encounter: Payer: Self-pay | Admitting: Family Medicine

## 2018-04-07 VITALS — BP 142/80 | HR 81 | Temp 98.1°F | Resp 16 | Ht 69.0 in | Wt 226.0 lb

## 2018-04-07 DIAGNOSIS — M5431 Sciatica, right side: Secondary | ICD-10-CM

## 2018-04-07 DIAGNOSIS — I10 Essential (primary) hypertension: Secondary | ICD-10-CM | POA: Diagnosis not present

## 2018-04-07 DIAGNOSIS — E119 Type 2 diabetes mellitus without complications: Secondary | ICD-10-CM

## 2018-04-07 MED ORDER — PREDNISONE 10 MG PO TABS: ORAL_TABLET | ORAL | 0 refills | Status: DC

## 2018-04-07 MED ORDER — CYCLOBENZAPRINE HCL 10 MG PO TABS: 10.0000 mg | ORAL_TABLET | Freq: Three times a day (TID) | ORAL | 0 refills | Status: DC | PRN

## 2018-04-07 MED ORDER — LOSARTAN POTASSIUM-HCTZ 100-25 MG PO TABS: 1.0000 | ORAL_TABLET | Freq: Every day | ORAL | 3 refills | Status: DC

## 2018-04-07 NOTE — Progress Notes (Signed)
   Subjective:    Patient ID: Lee Greene, male    DOB: [birth year], 57 y.o.   MRN: [id number]  HPI DM- stopped his metformin.  Pt has not been seen in >1 yr.  Overdue for eye exam, foot exam.  On ARB but pt is not taking this.  No CP, SOB, HAs, visual changes, no numbness/tingling of hands/feet.  Back pain- ~1 week ago pt 'twisted his back'.  Saturday pt developed what 'feels like a pulled hamstring but it goes all the way up' to lower back on R side.  No numbness/tingling.  Described as a pulling pain.  Improves w/ heat.  Worse w/ movement- particularly knee extension.  No known injury.  Taking 800mg  ibuprofen w/ mild improvement.  No bowel or bladder incontinence.   Review of Systems For ROS see HPI     Objective:   Physical Exam  Constitutional: He is oriented to person, place, and time. He appears well-developed and well-nourished. No distress.  obese  HENT:  Head: Normocephalic and atraumatic.  Eyes: Pupils are equal, round, and reactive to light. EOM are normal.  Cardiovascular: Intact distal pulses.  Musculoskeletal: He exhibits tenderness (TTP over R sciatic notch). He exhibits no edema.  Neurological: He is alert and oriented to person, place, and time. He displays normal reflexes. No cranial nerve deficit. Coordination normal.  Limping + SLR on R, (-) on L  Skin: Skin is warm and dry. No erythema.  Vitals reviewed.         Assessment & Plan:  Sciatica- new.  Pt is clearly uncomfortable.  Will start Prednisone to improve pain and inflammation.  Cautioned him that this will cause sugars to go up.  Flexeril prn.  Reviewed supportive care and red flags that should prompt return.  Pt expressed understanding and is in agreement w/ plan.

## 2018-04-07 NOTE — Assessment & Plan Note (Signed)
Deteriorated.  Pt admits that he has been out of BP meds (based on last prescription info, for almost 1 yr).  He is asymptomatic.  Stressed need to restart medication for both BP control and renal protection.  Check labs.  Will follow closely.

## 2018-04-07 NOTE — Patient Instructions (Signed)
Follow up in 3-4 months to recheck sugar We'll notify you of your lab results and make any changes if needed START the Prednisone as directed- take w/ food USE the cyclobenzaprine (muscle relaxer) as needed for pain and spasm- can cause drowsiness so good for nights/weekends HEAT! Gentle stretching to avoid stiffness once you are feeling better RESTART your Losartan for better BP control Call with any questions or concerns- particularly if not improving! Hang in there!!!

## 2018-04-07 NOTE — Assessment & Plan Note (Signed)
Chronic problem.  Pt is supposed to be on Metformin but has not taken recently.  Stressed need for routine follow ups, yearly eye exams, medication compliance.  Check labs to re-establish a baseline and determine the next steps.

## 2018-04-08 LAB — HEPATIC FUNCTION PANEL
ALT: 30 U/L (ref 0–53)
AST: 22 U/L (ref 0–37)
Albumin: 4.4 g/dL (ref 3.5–5.2)
Alkaline Phosphatase: 112 U/L (ref 39–117)
Bilirubin, Direct: 0.2 mg/dL (ref 0.0–0.3)
Total Bilirubin: 0.8 mg/dL (ref 0.2–1.2)
Total Protein: 7.1 g/dL (ref 6.0–8.3)

## 2018-04-08 LAB — LIPID PANEL
Cholesterol: 146 mg/dL (ref 0–200)
HDL: 60.6 mg/dL (ref >39.00)
NonHDL: 85.39
Total CHOL/HDL Ratio: 2
Triglycerides: 308 mg/dL — ABNORMAL HIGH (ref 0.0–149.0)
VLDL: 61.6 mg/dL — ABNORMAL HIGH (ref 0.0–40.0)

## 2018-04-08 LAB — CBC WITH DIFFERENTIAL/PLATELET
Basophils Absolute: 0.1 10*3/uL (ref 0.0–0.1)
Basophils Relative: 1.1 % (ref 0.0–3.0)
Eosinophils Absolute: 0.3 10*3/uL (ref 0.0–0.7)
Eosinophils Relative: 3.2 % (ref 0.0–5.0)
HCT: 48.6 % (ref 39.0–52.0)
Hemoglobin: 16.2 g/dL (ref 13.0–17.0)
Lymphocytes Relative: 35.5 % (ref 12.0–46.0)
Lymphs Abs: 2.9 10*3/uL (ref 0.7–4.0)
MCHC: 33.3 g/dL (ref 30.0–36.0)
MCV: 78.8 fl (ref 78.0–100.0)
Monocytes Absolute: 1.1 10*3/uL — ABNORMAL HIGH (ref 0.1–1.0)
Monocytes Relative: 12.9 % — ABNORMAL HIGH (ref 3.0–12.0)
Neutro Abs: 3.8 10*3/uL (ref 1.4–7.7)
Neutrophils Relative %: 47.3 % (ref 43.0–77.0)
Platelets: 237 10*3/uL (ref 150.0–400.0)
RBC: 6.17 Mil/uL — ABNORMAL HIGH (ref 4.22–5.81)
RDW: 13.9 % (ref 11.5–15.5)
WBC: 8.1 10*3/uL (ref 4.0–10.5)

## 2018-04-08 LAB — BASIC METABOLIC PANEL
BUN: 18 mg/dL (ref 6–23)
CO2: 28 mEq/L (ref 19–32)
Calcium: 9.3 mg/dL (ref 8.4–10.5)
Chloride: 104 mEq/L (ref 96–112)
Creatinine, Ser: 1.14 mg/dL (ref 0.40–1.50)
GFR: 85.78 mL/min (ref >60.00)
Glucose, Bld: 149 mg/dL — ABNORMAL HIGH (ref 70–99)
Potassium: 3.6 mEq/L (ref 3.5–5.1)
Sodium: 141 mEq/L (ref 135–145)

## 2018-04-08 LAB — LDL CHOLESTEROL, DIRECT: Direct LDL: 61 mg/dL

## 2018-04-08 LAB — HEMOGLOBIN A1C: Hgb A1c MFr Bld: 8.7 % — ABNORMAL HIGH (ref 4.6–6.5)

## 2018-04-08 LAB — TSH: TSH: 0.99 u[IU]/mL (ref 0.35–4.50)

## 2018-04-09 ENCOUNTER — Other Ambulatory Visit: Payer: Self-pay | Admitting: General Practice

## 2018-04-09 MED ORDER — METFORMIN HCL 500 MG PO TABS: 500.0000 mg | ORAL_TABLET | Freq: Two times a day (BID) | ORAL | 1 refills | Status: DC

## 2018-05-19 ENCOUNTER — Telehealth: Payer: Self-pay | Admitting: Family Medicine

## 2018-05-19 MED ORDER — HYDROCHLOROTHIAZIDE 25 MG PO TABS: 25.0000 mg | ORAL_TABLET | Freq: Every day | ORAL | 3 refills | Status: DC

## 2018-05-19 MED ORDER — LOSARTAN POTASSIUM 100 MG PO TABS: 100.0000 mg | ORAL_TABLET | Freq: Every day | ORAL | 3 refills | Status: DC

## 2018-05-19 NOTE — Telephone Encounter (Signed)
Copied from [place] [id number]. Topic: Quick Communication - See Telephone Encounter >> May 19, 2018 10:38 AM Bea Graff, NT wrote: CRM for notification. See Telephone encounter for: 05/19/18. Kristeen Miss with [facility] states the losartan-hydrochlorothiazide (HYZAAR) 100-25 MG tablet is on back order and they are unsure when they will be able to get the medicine in. They would like to see if the dr can call in the 2 separate pills instead of the combo in order to fill for the pt? Pt is out of medication. CB#: [phone]

## 2018-05-19 NOTE — Telephone Encounter (Signed)
Ok to split pills into the 2 components.  Please explain this to pt

## 2018-05-19 NOTE — Telephone Encounter (Signed)
Spoke with patient to let him know that the medication will be separate.   Stated verbal understanding.  Medication has been called in.

## 2018-09-28 ENCOUNTER — Encounter: Payer: Self-pay | Admitting: Family Medicine

## 2018-09-28 ENCOUNTER — Ambulatory Visit (INDEPENDENT_AMBULATORY_CARE_PROVIDER_SITE_OTHER): Payer: BLUE CROSS/BLUE SHIELD | Admitting: Family Medicine

## 2018-09-28 ENCOUNTER — Other Ambulatory Visit: Payer: Self-pay

## 2018-09-28 VITALS — BP 130/82 | HR 85 | Temp 98.7°F | Resp 16 | Ht 69.0 in | Wt 193.4 lb

## 2018-09-28 DIAGNOSIS — M5441 Lumbago with sciatica, right side: Secondary | ICD-10-CM

## 2018-09-28 DIAGNOSIS — E119 Type 2 diabetes mellitus without complications: Secondary | ICD-10-CM

## 2018-09-28 LAB — BASIC METABOLIC PANEL
BUN: 10 mg/dL (ref 6–23)
CO2: 30 mEq/L (ref 19–32)
Calcium: 9.8 mg/dL (ref 8.4–10.5)
Chloride: 98 mEq/L (ref 96–112)
Creatinine, Ser: 0.99 mg/dL (ref 0.40–1.50)
GFR: 94.81 mL/min (ref >60.00)
Glucose, Bld: 385 mg/dL — ABNORMAL HIGH (ref 70–99)
Potassium: 4.4 mEq/L (ref 3.5–5.1)
Sodium: 136 mEq/L (ref 135–145)

## 2018-09-28 LAB — HEMOGLOBIN A1C: Hgb A1c MFr Bld: 17.4 % — ABNORMAL HIGH (ref 4.6–6.5)

## 2018-09-28 MED ORDER — MELOXICAM 15 MG PO TABS: 15.0000 mg | ORAL_TABLET | Freq: Every day | ORAL | 0 refills | Status: DC

## 2018-09-28 MED ORDER — METHOCARBAMOL 750 MG PO TABS: 750.0000 mg | ORAL_TABLET | Freq: Three times a day (TID) | ORAL | 0 refills | Status: DC | PRN
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
## 2018-09-28 NOTE — Assessment & Plan Note (Signed)
Chronic problem.  Pt is not compliant w/ follow up.  He has lost 33 lbs- applauded his efforts.  Due for eye exam- pt to schedule.  Foot exam done.  On ARB for renal protection.  Check labs.  Adjust meds prn

## 2018-09-28 NOTE — Patient Instructions (Signed)
Follow up in 3-4 months to recheck diabetes We'll notify you of your lab results and make any changes if needed Continue to work on healthy diet and regular exercise- you are doing great! Please schedule an eye exam START the once daily Meloxicam- take w/ food USE the Methocarbamol as needed for spasm HEAT! Call with any questions or concerns Stay Safe!!

## 2018-09-29 ENCOUNTER — Other Ambulatory Visit: Payer: Self-pay | Admitting: Family Medicine

## 2018-09-29 DIAGNOSIS — E119 Type 2 diabetes mellitus without complications: Secondary | ICD-10-CM

## 2018-10-01 ENCOUNTER — Encounter: Payer: Self-pay | Admitting: Internal Medicine

## 2018-10-01 ENCOUNTER — Other Ambulatory Visit: Payer: Self-pay

## 2018-10-01 ENCOUNTER — Ambulatory Visit (INDEPENDENT_AMBULATORY_CARE_PROVIDER_SITE_OTHER): Payer: BLUE CROSS/BLUE SHIELD | Admitting: Internal Medicine

## 2018-10-01 VITALS — BP 138/92 | HR 87 | Temp 98.7°F | Ht 69.0 in | Wt 194.6 lb

## 2018-10-01 DIAGNOSIS — E119 Type 2 diabetes mellitus without complications: Secondary | ICD-10-CM | POA: Diagnosis not present

## 2018-10-01 DIAGNOSIS — E1165 Type 2 diabetes mellitus with hyperglycemia: Secondary | ICD-10-CM | POA: Diagnosis not present

## 2018-10-01 LAB — GLUCOSE, POCT (MANUAL RESULT ENTRY): POC Glucose: 388 mg/dl — AB (ref 70–99)

## 2018-10-01 MED ORDER — METFORMIN HCL ER 500 MG PO TB24: 1000.0000 mg | ORAL_TABLET | Freq: Two times a day (BID) | ORAL | 11 refills | Status: DC

## 2018-10-01 MED ORDER — GLUCOSE BLOOD VI STRP: ORAL_STRIP | 12 refills | Status: DC

## 2018-10-01 MED ORDER — INSULIN GLARGINE 100 UNIT/ML SOLOSTAR PEN: 20.0000 [IU] | PEN_INJECTOR | Freq: Every day | SUBCUTANEOUS | 11 refills | Status: DC

## 2018-10-01 MED ORDER — INSULIN PEN NEEDLE 32G X 4 MM MISC: 6 refills | Status: DC

## 2018-10-01 NOTE — Patient Instructions (Signed)
Start Metformin 500 mg XR 1 pill  With breakfast  x 1 week, then increase to 1 pill with Breakfast and Supper  x 1 week, then increase to 2 pills with Breakfast  and 1 pill with supper for another week , finally 2 pills twice a day with meals.   - Lantus 20 units at Bedtime    - Check sugar twice a day (fasting and Bedtime)   - HOW TO TREAT LOW BLOOD SUGARS (Blood sugar LESS THAN 70 MG/DL)  Please follow the RULE OF 15 for the treatment of hypoglycemia treatment (when your (blood sugars are less than 70 mg/dL)    STEP 1: Take 15 grams of carbohydrates when your blood sugar is low, which includes:   3-4 GLUCOSE TABS  OR  3-4 OZ OF JUICE OR REGULAR SODA OR  ONE TUBE OF GLUCOSE GEL     STEP 2: RECHECK blood sugar in 15 MINUTES STEP 3: If your blood sugar is still low at the 15 minute recheck --> then, go back to STEP 1 and treat AGAIN with another 15 grams of carbohydrates.

## 2018-10-01 NOTE — Progress Notes (Signed)
Name: Lee Greene  MRN/ DOB: [id number], 09/06/1960   Age/ Sex: 58 y.o., male    PCP: Midge Minium, MD   Reason for Endocrinology Evaluation: Type 2 Diabetes Mellitus     Date of Initial Endocrinology Visit: 10/01/2018     PATIENT IDENTIFIER: Lee Greene is a 58 y.o. male with a past medical history of HTN, T2DM. The patient presented for initial endocrinology clinic visit on 10/01/2018 for consultative assistance with his diabetes management.    HPI: Mr. Pfost was    Diagnosed with T2DM in 2013 Prior Medications tried/Intolerance: Metformin- GI intolerance  Currently checking blood sugars no x / day. Hypoglycemia episodes : no           Hemoglobin A1c has ranged from 6.7% in 2013, peaking at 17.4%  In 2020. Patient required assistance for hypoglycemia: no Patient has required hospitalization within the last 1 year from hyper or hypoglycemia: no  In terms of diet, the patient stopped drinking sugar-sweetened beverages 2 days ago.  He eats 2-3 meals a day.   [employer]   HOME DIABETES REGIMEN: Metformin 500 mg BID -    Statin: No ACE-I/ARB: Yes Prior Diabetic Education: no    METER DOWNLOAD SUMMARY: Did not    DIABETIC COMPLICATIONS: Microvascular complications:    Denies: CKD, retinopathy, neuropathy   Last eye exam: Completed 2 yrs ago   Macrovascular complications:    Denies: CAD, PVD, CVA   PAST HISTORY: Past Medical History:  Past Medical History:  Diagnosis Date  . Chest pain    no issues since 2014   . Diabetes mellitus without complication ([place])    diet controlled, no medicines   . Hypertension    off BP medicines x 3 months as of 07-03-15   Past Surgical History:  Past Surgical History:  Procedure Laterality Date  . HAND SURGERY     1990's      Social History:  reports that he has never smoked. He has never used smokeless tobacco. He reports that he does not drink alcohol or use drugs. Family  History:  Family History  Problem Relation Age of Onset  . Hypertension Father   . Colon cancer Neg Hx   . Colon polyps Neg Hx   . Esophageal cancer Neg Hx   . Rectal cancer Neg Hx   . Stomach cancer Neg Hx      HOME MEDICATIONS: Allergies as of 10/01/2018   No Known Allergies     Medication List       Accurate as of October 01, 2018 11:16 AM. Always use your most recent med list.        cyclobenzaprine 10 MG tablet Commonly known as:  FLEXERIL Take 1 tablet (10 mg total) by mouth 3 (three) times daily as needed for muscle spasms.   hydrochlorothiazide 25 MG tablet Commonly known as:  HYDRODIURIL Take 1 tablet (25 mg total) by mouth daily.   losartan 100 MG tablet Commonly known as:  COZAAR Take 1 tablet (100 mg total) by mouth daily.   meloxicam 15 MG tablet Commonly known as:  MOBIC Take 1 tablet (15 mg total) by mouth daily.   metFORMIN 500 MG tablet Commonly known as:  GLUCOPHAGE Take 1 tablet (500 mg total) by mouth 2 (two) times daily with a meal.   methocarbamol 750 MG tablet Commonly known as:  ROBAXIN Take 1 tablet (750 mg total) by mouth every 8 (eight) hours as needed for muscle  spasms.   tadalafil 20 MG tablet Commonly known as:  Cialis Take 1 tablet (20 mg total) by mouth daily as needed for erectile dysfunction.        ALLERGIES: No Known Allergies   REVIEW OF SYSTEMS: A comprehensive ROS was conducted with the patient and is negative except as per HPI and below:  Review of Systems  Constitutional: Negative for chills.  HENT: Negative for congestion and sore throat.   Eyes: Positive for blurred vision. Negative for pain.  Respiratory: Negative for cough and shortness of breath.   Cardiovascular: Negative for chest pain and palpitations.  Gastrointestinal: Positive for diarrhea and nausea.  Genitourinary: Positive for frequency.  Skin: Negative.   Neurological: Negative for tingling and tremors.  Endo/Heme/Allergies: Positive for  polydipsia.  Psychiatric/Behavioral: Negative for depression. The patient is not nervous/anxious.       OBJECTIVE:   VITAL SIGNS: BP (!) 138/92 (BP Location: Left Arm, Patient Position: Sitting, Cuff Size: Normal)   Pulse 87   Temp 98.7 F (37.1 C)   Ht 5\' 9"  (1.753 m)   Wt 194 lb 9.6 oz (88.3 kg)   SpO2 97%   BMI 28.74 kg/m    PHYSICAL EXAM:  General: Pt appears well and is in NAD  Hydration: Well-hydrated with moist mucous membranes and good skin turgor  HEENT: Head: Unremarkable with good dentition. Oropharynx clear without exudate.  Eyes: External eye exam normal without stare, lid lag or exophthalmos.  EOM intact.  PERRL.  Neck: General: Supple without adenopathy or carotid bruits. Thyroid: Thyroid size normal.  No goiter or nodules appreciated. No thyroid bruit.  Lungs: Clear with good BS bilat with no rales, rhonchi, or wheezes  Heart: RRR with normal S1 and S2 and no gallops; no murmurs; no rub  Abdomen: Normoactive bowel sounds, soft, nontender, without masses or organomegaly palpable  Extremities:  Lower extremities - No pretibial edema. No lesions.  Skin: Normal texture and temperature to palpation. No rash noted. No Acanthosis nigricans/skin tags. No lipohypertrophy.  Neuro: MS is good with appropriate affect, pt is alert and Ox3    DM foot exam:  DM foot exam: The skin of the feet is intact without sores or ulcerations. The pedal pulses are 2+ on right and 2+ on left. The sensation is intact to a screening 5.07, 10 gram monofilament bilaterally   DATA REVIEWED:  Lab Results  Component Value Date   HGBA1C 17.4 (H) 09/28/2018   HGBA1C 8.7 (H) 04/07/2018   HGBA1C 7.3 (H) 03/20/2017   Lab Results  Component Value Date   MICROALBUR 1.5 02/03/2013   LDLCALC 53 03/20/2017   CREATININE 0.99 09/28/2018   Lab Results  Component Value Date   MICRALBCREAT 0.6 02/03/2013    Lab Results  Component Value Date   CHOL 146 04/07/2018   HDL 60.60 04/07/2018    LDLCALC 53 03/20/2017   LDLDIRECT 61.0 04/07/2018   TRIG 308.0 (H) 04/07/2018   CHOLHDL 2 04/07/2018        ASSESSMENT / PLAN / RECOMMENDATIONS:   1) Type 2 Diabetes Mellitus, poorly controlled, Without complications - Most recent A1c of 17.4%. Goal A1c < 7.0 %.  Plan: GENERAL:  Poorly controlled diabetes due to medication non-adherence and dietary indiscretions.  I have discussed with the patient the pathophysiology of diabetes. We went over the natural progression of the disease. We talked about both insulin resistance and insulin deficiency. We stressed the importance of lifestyle changes including diet and exercise. I explained the  complications associated with diabetes including retinopathy, nephropathy, neuropathy as well as increased risk of cardiovascular disease. We went over the benefit seen with glycemic control.    I explained to the patient that diabetic patients are at higher than normal risk for amputations. The patient was informed that diabetes is the number one cause of non-traumatic amputations in [place]. The patient was advised to look and examine their feet , wear proper fitting shoes and not to go barefoot.   Will refer him to our CDE for proper diabetes education.   He is not compliant with his current metformin due to nausea and diarrhea.   I have explained to him that he is currently glucose toxic and oral glycemic agents are going to take a long time to improve his glucose if at all. I have advised him that insulin would be the best option as this time as it will improve his glucose rapidly, and once they are at acceptable control will consider adding oral glycemic agents and be able to stop the insulin at some point.   We also discussed other glucose agents such as TZD's, SU, SGLT-2 inhibitors and GLP-1 agonists for future considerations, we discussed the benefit and the risk of each class.   Pt was educated on how to use insulin pen and how to use a glucose  meter, glucose meter was provided today.   MEDICATIONS:  Metformin 500 mg XR 2 tabs BID- titration provided  Lantus 20 units daily   EDUCATION / INSTRUCTIONS:  BG monitoring instructions: Patient is instructed to check his blood sugars 2 times a day, fasting and bedtime.  Call [facility] [facility] if: BG persistently < 70 or > 300. . I reviewed the Rule of 15 for the treatment of hypoglycemia in detail with the patient. Literature supplied.   2) Diabetic complications:   Eye: Does not have known diabetic retinopathy.   Neuro/ Feet: Does not have known diabetic peripheral neuropathy.  Renal: Patient does not have known baseline CKD. He is  on an ACEI/ARB at present.   3) Lipids: Patient is not on a statin. Pt should be on a moderate intensity statin ( Atorvastatin 20 mg ) , I will defer this to his PCP , if no contraindications exist.    4) Hypertension: he is not  at goal of < 140/90 mmHg.   F/u in 6 weeks     Signed electronically by: Mack Guise, MD  [facility]  [facility] [address] [place], [place] [address] Phone: [phone] FAX: [phone]   CC: Midge Minium, MD [address] [place] [address] Phone: [phone]  Fax: [phone]    Return to Endocrinology clinic as below: Future Appointments  Date Time Provider [place]  12/31/2018  9:30 AM Midge Minium, MD [facility]

## 2018-11-03 LAB — HM DIABETES EYE EXAM

## 2018-11-12 ENCOUNTER — Other Ambulatory Visit: Payer: Self-pay

## 2018-11-12 ENCOUNTER — Ambulatory Visit (INDEPENDENT_AMBULATORY_CARE_PROVIDER_SITE_OTHER): Payer: BLUE CROSS/BLUE SHIELD | Admitting: Internal Medicine

## 2018-11-12 ENCOUNTER — Encounter: Payer: Self-pay | Admitting: Internal Medicine

## 2018-11-12 DIAGNOSIS — E119 Type 2 diabetes mellitus without complications: Secondary | ICD-10-CM

## 2018-11-12 DIAGNOSIS — E1165 Type 2 diabetes mellitus with hyperglycemia: Secondary | ICD-10-CM

## 2018-11-12 MED ORDER — DAPAGLIFLOZIN PROPANEDIOL 10 MG PO TABS: 10.0000 mg | ORAL_TABLET | Freq: Every day | ORAL | 11 refills | Status: DC

## 2018-11-12 MED ORDER — DAPAGLIFLOZIN PROPANEDIOL 5 MG PO TABS: 5.0000 mg | ORAL_TABLET | Freq: Every day | ORAL | 0 refills | Status: DC

## 2018-11-12 NOTE — Progress Notes (Signed)
Virtual Visit via Video Note  I connected with Lee Greene on 11/12/18 at 10:30 AM EDT by a video enabled telemedicine application and verified that I am speaking with the correct person using two identifiers.   I discussed the limitations of evaluation and management by telemedicine and the availability of in person appointments. The patient expressed understanding and agreed to proceed.   -Location of the patient :Home -Location of the provider : Office -The names of all persons participating in the telemedicine service : Lee Greene and myself , Wife Lee Greene         Name: Lee Greene  Age/ Sex: 58 y.o., male   MRN/ DOB: [id number], 16-Jan-1961     PCP: Lee Minium, MD   Reason for Endocrinology Evaluation: Type 2 Diabetes Mellitus     Initial Endocrinology Clinic Visit: 10/01/2018    PATIENT IDENTIFIER: Lee Greene is a 58 y.o. male with a past medical history of HTN and T2DM. The patient has followed with Endocrinology clinic since 10/01/2018 for consultative assistance with management of his diabetes.  DIABETIC HISTORY:  Lee Greene was diagnosed with T2DM in  2013. He was on oral glycemic agents since his diagnosis. His hemoglobin A1c has ranged from 6.7% in 2013, peaking at 17.4%  In 2020.  On his initial visit to our clinic his A1c was 17.4 %. He was on Metformin. He was started on Lantus in 09/2018  He works as an Mining engineer.  SUBJECTIVE:   During the last visit (10/01/2018): A1c 17.4 %. Increased Metformin to maximum dose and started Lantus .  Today (11/12/2018): Lee Greene is here for a virtual 6 week follow up on his diabetes management.  He checks his blood sugars 2 times daily, preprandial to breakfast and bedtime. The patient has not had hypoglycemic episodes since the last clinic visit. Otherwise, the patient has not required any recent emergency interventions for hypoglycemia and has not had recent hospitalizations secondary to hyper or  hypoglycemic episodes.    ROS: As per HPI and as detailed below: Review of Systems  Constitutional: Negative for fever.  Respiratory: Negative for cough and shortness of breath.   Gastrointestinal: Negative for diarrhea and nausea.  Genitourinary: Negative for frequency.  Endo/Heme/Allergies: Negative for polydipsia.      HOME DIABETES REGIMEN:  Metformin 500 mg XR 2 Tabs BID Lantus 20 units daily      GLUCOSE LOG:    Date Breakfast  Supper Bedtime  11/12/2018 102    4/30 87  171  4/29 99 74   4/28 86  163  4/27 93 82      HISTORY:  Past Medical History:  Past Medical History:  Diagnosis Date  . Chest pain    no issues since 2014   . Diabetes mellitus without complication ([place])    diet controlled, no medicines   . Hypertension    off BP medicines x 3 months as of 07-03-15    Past Surgical History:  Past Surgical History:  Procedure Laterality Date  . HAND SURGERY     1990's     Social History:  reports that he has never smoked. He has never used smokeless tobacco. He reports that he does not drink alcohol or use drugs. Family History:  Family History  Problem Relation Age of Onset  . Hypertension Father   . Diabetes Father   . Colon cancer Neg Hx   . Colon polyps Neg Hx   . Esophageal cancer  Neg Hx   . Rectal cancer Neg Hx   . Stomach cancer Neg Hx       HOME MEDICATIONS: Allergies as of 11/12/2018   No Known Allergies     Medication List       Accurate as of Nov 12, 2018  7:40 AM. Always use your most recent med list.        cyclobenzaprine 10 MG tablet Commonly known as:  FLEXERIL Take 1 tablet (10 mg total) by mouth 3 (three) times daily as needed for muscle spasms.   glucose blood test strip Commonly known as:  OneTouch Verio Twice a day   hydrochlorothiazide 25 MG tablet Commonly known as:  HYDRODIURIL Take 1 tablet (25 mg total) by mouth daily.   Insulin Glargine 100 UNIT/ML Solostar Pen Commonly known as:  Lantus SoloStar  Inject 20 Units into the skin at bedtime.   Insulin Pen Needle 32G X 4 MM Misc Once daily   losartan 100 MG tablet Commonly known as:  COZAAR Take 1 tablet (100 mg total) by mouth daily.   meloxicam 15 MG tablet Commonly known as:  MOBIC Take 1 tablet (15 mg total) by mouth daily.   metFORMIN 500 MG 24 hr tablet Commonly known as:  Glucophage XR Take 2 tablets (1,000 mg total) by mouth 2 (two) times daily with a meal.   methocarbamol 750 MG tablet Commonly known as:  ROBAXIN Take 1 tablet (750 mg total) by mouth every 8 (eight) hours as needed for muscle spasms.   tadalafil 20 MG tablet Commonly known as:  Cialis Take 1 tablet (20 mg total) by mouth daily as needed for erectile dysfunction.         DATA REVIEWED:  Lab Results  Component Value Date   HGBA1C 17.4 (H) 09/28/2018   HGBA1C 8.7 (H) 04/07/2018   HGBA1C 7.3 (H) 03/20/2017   Lab Results  Component Value Date   MICROALBUR 1.5 02/03/2013   LDLCALC 53 03/20/2017   CREATININE 0.99 09/28/2018   Lab Results  Component Value Date   MICRALBCREAT 0.6 02/03/2013     Lab Results  Component Value Date   CHOL 146 04/07/2018   HDL 60.60 04/07/2018   LDLCALC 53 03/20/2017   LDLDIRECT 61.0 04/07/2018   TRIG 308.0 (H) 04/07/2018   CHOLHDL 2 04/07/2018         ASSESSMENT / PLAN / RECOMMENDATIONS:   1) Type 2 Diabetes Mellitus, poorly controlled, Without complications - Most recent A1c of 17.4%. Goal A1c < 7.0 %.   Plan:  - Praised Lee Greene on lifestyle changes and glucose checks - He has been snacking a few times a day, he has been eating fruits and chips. We discussed avoiding snacks when possible and also discussed low CHO snacks when necessary.  - He has been able to tolerate maximum dose of metformin without side effects - We also discussed add on oral glycemic agents in the long term goal of stopping basal insulin. We discussed SU as well as SGLT-2 inhibitors. We discussed the risk and benefits of both  classes. Lee Greene agreed to ty farxiga, we discussed the benefit of glucose control, BP improvement, weight loss and cardiovascular benefits. We also discussed risk of genital infections ( Lee Greene is NOT circucised- I warned him he is at higher risk for infection) - Lee Greene would like to proceed with farxiga, co-pay car info provided.  - He does not want to continue taking lantus at a reduced dose, he would like to stop  taking it all together, I have tried to convince him to stay on a smaller dose, but his wife has been injecting him with insulin and is tired. They were advised to monitor glucose and restart Lantus should his BG's remain consistently above 180 mg/dL   MEDICATIONS:  Continue Metformin 500 mg 2 tabs BID  Farxiga 5 mg daily for 30 day, then increase to 10 mg daily   If he is going to restart Lantus , the dose is 10 units daily   EDUCATION / INSTRUCTIONS:  BG monitoring instructions: Patient is instructed to check his blood sugars 2 times a day, fasting and bedtime.  Call [place] [facility] clinic if: BG persistently < 70 or > 300. . I reviewed the Rule of 15 for the treatment of hypoglycemia in detail with the patient. Literature supplied.     I discussed the assessment and treatment plan with the patient. The patient was provided an opportunity to ask questions and all were answered. The patient agreed with the plan and demonstrated an understanding of the instructions.   The patient was advised to call back or seek an in-person evaluation if the symptoms worsen or if the condition fails to improve as anticipated.    F/U in 3 months    Signed electronically by: Mack Guise, MD  [facility]  [facility] [address] [place], [place] [address] Phone: [phone] FAX: [phone]   CC: Lee Minium, MD [address] [place] [address] Phone: [phone]  Fax: [phone]  Return to Endocrinology clinic  as below: Future Appointments  Date Time Provider [place]  11/12/2018 10:30 AM , Melanie Crazier, MD [facility] None  12/31/2018  9:30 AM Lee Minium, MD [facility]

## 2018-11-13 DIAGNOSIS — E119 Type 2 diabetes mellitus without complications: Secondary | ICD-10-CM | POA: Diagnosis not present

## 2018-12-09 ENCOUNTER — Telehealth: Payer: Self-pay | Admitting: Internal Medicine

## 2018-12-09 NOTE — Telephone Encounter (Signed)
Patient returned Tashia's call. Patient requests to be called at ph# [phone]

## 2018-12-09 NOTE — Telephone Encounter (Signed)
Patient wanted to speak with the doctor and let her know what he has been doing with his medications and did not specify.  Please Advise, Thanks

## 2018-12-09 NOTE — Telephone Encounter (Signed)
lft pt vm to return call to discuss medications

## 2018-12-10 NOTE — Telephone Encounter (Signed)
lft vm to return call to discuss medications per previous phone note

## 2018-12-10 NOTE — Telephone Encounter (Signed)
Pt informed and has follow up scheduled for 02/11/2019

## 2018-12-10 NOTE — Telephone Encounter (Signed)
Spoke to pt and his wife and they wanted to inform you that he has stopped taking farxiga(22 days) and that his blood sugars have been consistently 90-1120 along with dieting and exercise. They would like to know if/when he can taper off of the 4 tabs a day of metformin that he is currently taking due to concerns of prolonged usage causing liver damage.Please advise.

## 2018-12-21 ENCOUNTER — Telehealth: Payer: Self-pay | Admitting: Family Medicine

## 2018-12-21 NOTE — Telephone Encounter (Signed)
Pt would like to come in for an in office visit, so he can get his bw done at the same time. Can we change his appt to a in office visit if he passes the screening?

## 2018-12-22 NOTE — Telephone Encounter (Signed)
Appt. Made for pt

## 2018-12-22 NOTE — Telephone Encounter (Signed)
Ok to schedule.

## 2018-12-31 ENCOUNTER — Ambulatory Visit: Payer: BLUE CROSS/BLUE SHIELD | Admitting: Family Medicine

## 2019-01-03 ENCOUNTER — Ambulatory Visit (INDEPENDENT_AMBULATORY_CARE_PROVIDER_SITE_OTHER): Payer: BC Managed Care – PPO | Admitting: Family Medicine

## 2019-01-03 ENCOUNTER — Other Ambulatory Visit: Payer: Self-pay

## 2019-01-03 ENCOUNTER — Encounter: Payer: Self-pay | Admitting: Family Medicine

## 2019-01-03 VITALS — BP 120/80 | HR 102 | Temp 97.7°F | Resp 16 | Ht 69.0 in | Wt 189.1 lb

## 2019-01-03 DIAGNOSIS — I10 Essential (primary) hypertension: Secondary | ICD-10-CM

## 2019-01-03 DIAGNOSIS — E119 Type 2 diabetes mellitus without complications: Secondary | ICD-10-CM | POA: Diagnosis not present

## 2019-01-03 DIAGNOSIS — E663 Overweight: Secondary | ICD-10-CM | POA: Diagnosis not present

## 2019-01-03 NOTE — Assessment & Plan Note (Signed)
Pt continues to lose weight.  Applauded his efforts.  Will continue to follow.

## 2019-01-03 NOTE — Assessment & Plan Note (Signed)
Chronic problem.  Well controlled.  Asymptomatic.  Check labs.  No anticipated med changes.  Will follow. 

## 2019-01-03 NOTE — Progress Notes (Signed)
   Subjective:    Patient ID: Lee Greene, male    DOB: Jul 22, 1960, 58 y.o.   MRN: [id number]  HPI DM- chronic problem, on Farxiga 10mg  daily and Metformin XR 1000mg  BID.  UTD on eye exam, foot exam.  Denies symptomatic lows, no numbness/tingling of hands/feet.  HTN- chronic problem, on HCTZ 25mg  daily and Losartan 100mg  daily w/ adequate control.  Denies CP, SOB, HAs, visual changes, edema.  Overweight- pt is down another 4 lbs.  'i'm doing a lot of walking'.     Review of Systems For ROS see HPI     Objective:   Physical Exam Vitals signs reviewed.  Constitutional:      General: He is not in acute distress.    Appearance: He is well-developed.  HENT:     Head: Normocephalic and atraumatic.  Eyes:     Conjunctiva/sclera: Conjunctivae normal.     Pupils: Pupils are equal, round, and reactive to light.  Neck:     Musculoskeletal: Normal range of motion and neck supple.     Thyroid: No thyromegaly.  Cardiovascular:     Rate and Rhythm: Normal rate and regular rhythm.     Heart sounds: Normal heart sounds. No murmur.  Pulmonary:     Effort: Pulmonary effort is normal. No respiratory distress.     Breath sounds: Normal breath sounds.  Abdominal:     General: Bowel sounds are normal. There is no distension.     Palpations: Abdomen is soft.  Lymphadenopathy:     Cervical: No cervical adenopathy.  Skin:    General: Skin is warm and dry.  Neurological:     Mental Status: He is alert and oriented to person, place, and time.     Cranial Nerves: No cranial nerve deficit.  Psychiatric:        Behavior: Behavior normal.          Assessment & Plan:

## 2019-01-03 NOTE — Assessment & Plan Note (Signed)
Chronic problem.  Pt has lost an incredible amount of weight and is doing very well.  UTD on foot exam and eye exam.  On ARB for renal protection.  Check labs.  Adjust meds prn

## 2019-01-03 NOTE — Patient Instructions (Signed)
Schedule your complete physical in 3-4 months We'll notify you of your lab results and make any changes if needed Continue to work on healthy diet and regular exercise- you look great!!! Call with any questions or concerns Stay Safe!!!

## 2019-01-04 ENCOUNTER — Encounter: Payer: Self-pay | Admitting: General Practice

## 2019-01-04 LAB — LIPID PANEL
Cholesterol: 152 mg/dL (ref 0–200)
HDL: 70.4 mg/dL (ref >39.00)
LDL Cholesterol: 52 mg/dL (ref 0–99)
NonHDL: 81.28
Total CHOL/HDL Ratio: 2
Triglycerides: 147 mg/dL (ref 0.0–149.0)
VLDL: 29.4 mg/dL (ref 0.0–40.0)

## 2019-01-04 LAB — CBC WITH DIFFERENTIAL/PLATELET
Basophils Absolute: 0.1 10*3/uL (ref 0.0–0.1)
Basophils Relative: 0.6 % (ref 0.0–3.0)
Eosinophils Absolute: 0.2 10*3/uL (ref 0.0–0.7)
Eosinophils Relative: 1.4 % (ref 0.0–5.0)
HCT: 47.3 % (ref 39.0–52.0)
Hemoglobin: 15.2 g/dL (ref 13.0–17.0)
Lymphocytes Relative: 28.9 % (ref 12.0–46.0)
Lymphs Abs: 3.4 10*3/uL (ref 0.7–4.0)
MCHC: 32.1 g/dL (ref 30.0–36.0)
MCV: 80.5 fl (ref 78.0–100.0)
Monocytes Absolute: 0.9 10*3/uL (ref 0.1–1.0)
Monocytes Relative: 8.1 % (ref 3.0–12.0)
Neutro Abs: 7.1 10*3/uL (ref 1.4–7.7)
Neutrophils Relative %: 61 % (ref 43.0–77.0)
Platelets: 290 10*3/uL (ref 150.0–400.0)
RBC: 5.87 Mil/uL — ABNORMAL HIGH (ref 4.22–5.81)
RDW: 14.1 % (ref 11.5–15.5)
WBC: 11.6 10*3/uL — ABNORMAL HIGH (ref 4.0–10.5)

## 2019-01-04 LAB — HEPATIC FUNCTION PANEL
ALT: 17 U/L (ref 0–53)
AST: 17 U/L (ref 0–37)
Albumin: 4.5 g/dL (ref 3.5–5.2)
Alkaline Phosphatase: 60 U/L (ref 39–117)
Bilirubin, Direct: 0.2 mg/dL (ref 0.0–0.3)
Total Bilirubin: 1.3 mg/dL — ABNORMAL HIGH (ref 0.2–1.2)
Total Protein: 6.9 g/dL (ref 6.0–8.3)

## 2019-01-04 LAB — BASIC METABOLIC PANEL
BUN: 15 mg/dL (ref 6–23)
CO2: 28 mEq/L (ref 19–32)
Calcium: 9.8 mg/dL (ref 8.4–10.5)
Chloride: 101 mEq/L (ref 96–112)
Creatinine, Ser: 1.03 mg/dL (ref 0.40–1.50)
GFR: 90.48 mL/min (ref >60.00)
Glucose, Bld: 89 mg/dL (ref 70–99)
Potassium: 3.9 mEq/L (ref 3.5–5.1)
Sodium: 140 mEq/L (ref 135–145)

## 2019-01-04 LAB — HEMOGLOBIN A1C: Hgb A1c MFr Bld: 8 % — ABNORMAL HIGH (ref 4.6–6.5)

## 2019-01-04 LAB — TSH: TSH: 1.11 u[IU]/mL (ref 0.35–4.50)

## 2019-01-05 ENCOUNTER — Other Ambulatory Visit: Payer: Self-pay | Admitting: Family Medicine

## 2019-02-03 ENCOUNTER — Telehealth: Payer: Self-pay | Admitting: General Practice

## 2019-02-03 MED ORDER — CONTOUR NEXT ONE KIT: 1.0000 | PACK | Freq: Two times a day (BID) | 3 refills | Status: DC | PRN

## 2019-02-03 MED ORDER — CONTOUR NEXT TEST VI STRP: 1.0000 | ORAL_STRIP | Freq: Two times a day (BID) | 12 refills | Status: DC | PRN

## 2019-02-03 NOTE — Telephone Encounter (Signed)
Received a fax from pt insurance company that informed his one touch verio test strips are no longer covered. Spoke with pt and he agreed to switch to [employer] next supplies.

## 2019-02-08 ENCOUNTER — Other Ambulatory Visit: Payer: Self-pay

## 2019-02-11 ENCOUNTER — Other Ambulatory Visit: Payer: Self-pay

## 2019-02-11 ENCOUNTER — Ambulatory Visit (INDEPENDENT_AMBULATORY_CARE_PROVIDER_SITE_OTHER): Payer: BC Managed Care – PPO | Admitting: Internal Medicine

## 2019-02-11 ENCOUNTER — Encounter: Payer: Self-pay | Admitting: Internal Medicine

## 2019-02-11 VITALS — BP 122/72 | HR 78 | Temp 98.4°F | Ht 69.0 in | Wt 196.2 lb

## 2019-02-11 DIAGNOSIS — E119 Type 2 diabetes mellitus without complications: Secondary | ICD-10-CM

## 2019-02-11 DIAGNOSIS — E1165 Type 2 diabetes mellitus with hyperglycemia: Secondary | ICD-10-CM

## 2019-02-11 NOTE — Patient Instructions (Signed)
-   You have done an amazing work with your sugar control, please keep up the good work.   - Continue Metformin 500 mg, 1 tablet twice daily, check with your pharmacy about the recall, if its recalled, please notify us.

## 2019-02-11 NOTE — Progress Notes (Signed)
Name: Lee Greene  Age/ Sex: 58 y.o., male   MRN/ DOB: [id number], 01-09-1961     PCP: Midge Minium, MD   Reason for Endocrinology Evaluation: Type 2 Diabetes Mellitus     Initial Endocrinology Clinic Visit: 10/01/2018    PATIENT IDENTIFIER: Lee Greene is a 58 y.o. male with a past medical history of HTN and T2DM. The patient has followed with Endocrinology clinic since 10/01/2018 for consultative assistance with management of his diabetes.  DIABETIC HISTORY:  Lee Greene was diagnosed with T2DM in  2013. He was on oral glycemic agents since his diagnosis. His hemoglobin A1c has ranged from 6.7% in 2013, peaking at 17.4%  In 2020.  On his initial visit to our clinic his A1c was 17.4 %. He was on Metformin. He was started on Lantus in 09/2018 but stopped in 11/2018 [place] 11/2018   He works as an Mining engineer.  SUBJECTIVE:   During the last visit (11/12/2018): We continued Metformin, started farxiga, lantus was stopped   Today (02/11/2019): Lee Greene is here for a 3 month  follow up on his diabetes management.  He checks his blood sugars 2 times daily, preprandial to breakfast and dinner. The patient has not had hypoglycemic episodes since the last clinic visit. Otherwise, the patient has not required any recent emergency interventions for hypoglycemia and has not had recent hospitalizations secondary to hyper or hypoglycemic episodes.   He was unable to tolerate 5 mg farxiga due to feeling of fatigue  ROS: As per HPI and as detailed below: Review of Systems  Constitutional: Negative for fever.  Respiratory: Negative for cough and shortness of breath.   Cardiovascular: Negative for chest pain and palpitations.  Gastrointestinal: Negative for diarrhea and nausea.  Genitourinary: Negative for frequency.  Endo/Heme/Allergies: Negative for polydipsia.      HOME DIABETES REGIMEN:  Metformin 500 mg XR 1 Tabs BID Farxiga 10 mg daily -  stopped     METER DOWNLOAD SUMMARY: 7/18-7/31/20 Fingerstick Blood Glucose Tests = 19 Average Number Tests/Day = 0.6 Overall Mean FS Glucose = 103   BG Ranges: Low = 88 High = 112   Hypoglycemic Events/30 Days: BG < 50 = 0 Episodes of symptomatic severe hypoglycemia = 0        HISTORY:  Past Medical History:  Past Medical History:  Diagnosis Date  . Chest pain    no issues since 2014   . Diabetes mellitus without complication ([place])    diet controlled, no medicines   . Hypertension    off BP medicines x 3 months as of 07-03-15   Past Surgical History:  Past Surgical History:  Procedure Laterality Date  . HAND SURGERY     1990's    Social History:  reports that he has never smoked. He has never used smokeless tobacco. He reports that he does not drink alcohol or use drugs. Family History:  Family History  Problem Relation Age of Onset  . Hypertension Father   . Diabetes Father   . Colon cancer Neg Hx   . Colon polyps Neg Hx   . Esophageal cancer Neg Hx   . Rectal cancer Neg Hx   . Stomach cancer Neg Hx      HOME MEDICATIONS: Allergies as of 02/11/2019   No Known Allergies     Medication List       Accurate as of February 11, 2019 10:40 AM. If you have any questions,  ask your nurse or doctor.        Contour Next One Kit 1 each by Does not apply route 2 (two) times daily as needed. Dx. E11.9   Contour Next Test test strip Generic drug: glucose blood 1 each by Other route 2 (two) times daily as needed for other. Use as instructed   dapagliflozin propanediol 10 MG Tabs tablet Commonly known as: Farxiga Take 10 mg by mouth daily. To be started after 5 mg dose is finished   hydrochlorothiazide 25 MG tablet Commonly known as: HYDRODIURIL Take 1 tablet (25 mg total) by mouth daily.   losartan 100 MG tablet Commonly known as: COZAAR Take 1 tablet (100 mg total) by mouth daily.   losartan-hydrochlorothiazide 100-25 MG tablet Commonly known as:  HYZAAR TAKE 1 TABLET BY MOUTH DAILY   metFORMIN 500 MG 24 hr tablet Commonly known as: Glucophage XR Take 2 tablets (1,000 mg total) by mouth 2 (two) times daily with a meal. What changed: when to take this       OBJECTIVE:   VITAL SIGNS:BP 122/72 (BP Location: Left Arm, Patient Position: Sitting, Cuff Size: Normal)   Pulse 78   Temp 98.4 F (36.9 C)   Ht _0  (1.753 m)   Wt 196 lb 3.2 oz (89 kg)   SpO2 98%   BMI 28.97 kg/m   PHYSICAL EXAM:  General: Pt appears well and is in NAD  Neck: General: Supple without adenopathy or carotid bruits. Thyroid: Thyroid size normal.  No goiter or nodules appreciated. No thyroid bruit.  Lungs: Clear with good BS bilat with no rales, rhonchi, or wheezes  Heart: RRR with normal S1 and S2 and no gallops; no murmurs; no rub  Abdomen: Normoactive bowel sounds, soft, nontender, without masses or organomegaly palpable  Extremities:  Lower extremities - No pretibial edema. No lesions.  Skin: Normal texture and temperature to palpation. No rash noted.   Neuro: MS is good with appropriate affect, pt is alert and Ox3    DM foot exam: 10/01/18  The skin of the feet is intact without sores or ulcerations. The pedal pulses are 2+ on right and 2+ on left. The sensation is intact to a screening 5.07, 10 gram monofilament bilaterally   DATA REVIEWED:  Lab Results  Component Value Date   HGBA1C 8.0 (H) 01/03/2019   HGBA1C 17.4 (H) 09/28/2018   HGBA1C 8.7 (H) 04/07/2018   Lab Results  Component Value Date   MICROALBUR 1.5 02/03/2013   LDLCALC 52 01/03/2019   CREATININE 1.03 01/03/2019   Lab Results  Component Value Date   MICRALBCREAT 0.6 02/03/2013     Lab Results  Component Value Date   CHOL 152 01/03/2019   HDL 70.40 01/03/2019   LDLCALC 52 01/03/2019   LDLDIRECT 61.0 04/07/2018   TRIG 147.0 01/03/2019   CHOLHDL 2 01/03/2019         ASSESSMENT / PLAN / RECOMMENDATIONS:   1) Type 2 Diabetes Mellitus, poorly controlled,  Without complications - Most recent A1c of 8.0 %. Goal A1c < 7.0 %.   Plan: - He has done an amazing job with lifestyle changes with just metformin, I have congratulated him on this, but I also explained to him that the goal of an A1c is 7.0 % of less.  - He has been off lantus since may, did not require it with BG's running in the 100's.  - He was unable to tolerate [place] due to fatigue  MEDICATIONS:  Continue Metformin 500 mg 2 tabs BID  EDUCATION / INSTRUCTIONS:  BG monitoring instructions: Patient is instructed to check his blood sugars 2 times a day, fasting and bedtime.  Call [facility] if: BG persistently < 70 or > 300. . I reviewed the Rule of 15 for the treatment of hypoglycemia in detail with the patient. Literature supplied.       F/U in 3 months    Signed electronically by: Mack Guise, MD  [facility]  [facility] [address] [place], [place] [address] Phone: [phone] FAX: [phone]   CC: Midge Minium, MD [address] [place] [address] Phone: [phone]  Fax: [phone]  Return to Endocrinology clinic as below: Future Appointments  Date Time Provider [place]  05/02/2019  9:00 AM Midge Minium, MD [facility]

## 2019-02-22 ENCOUNTER — Telehealth: Payer: Self-pay | Admitting: *Deleted

## 2019-02-22 NOTE — Telephone Encounter (Signed)
Patient states that he is tired of all the finger sticks that his fingers are raw. He is asking if he qualifies for the freestyle [place] system that is 14 days so that he would not have to stick himself every day.

## 2019-02-22 NOTE — Telephone Encounter (Signed)
Called and spoke with pt, he is going to contact his insurance company and verify if they cover the freestyle libre sensor or if they have a arm stick lancet that is preferred. Will call back to office to inform.

## 2019-04-26 DIAGNOSIS — Z23 Encounter for immunization: Secondary | ICD-10-CM | POA: Diagnosis not present

## 2019-05-02 ENCOUNTER — Ambulatory Visit (INDEPENDENT_AMBULATORY_CARE_PROVIDER_SITE_OTHER): Payer: BC Managed Care – PPO | Admitting: Family Medicine

## 2019-05-02 ENCOUNTER — Encounter: Payer: Self-pay | Admitting: Family Medicine

## 2019-05-02 ENCOUNTER — Other Ambulatory Visit: Payer: Self-pay

## 2019-05-02 VITALS — BP 122/76 | HR 97 | Ht 69.0 in | Wt 200.0 lb

## 2019-05-02 DIAGNOSIS — Z125 Encounter for screening for malignant neoplasm of prostate: Secondary | ICD-10-CM

## 2019-05-02 DIAGNOSIS — E119 Type 2 diabetes mellitus without complications: Secondary | ICD-10-CM

## 2019-05-02 DIAGNOSIS — Z0000 Encounter for general adult medical examination without abnormal findings: Secondary | ICD-10-CM | POA: Diagnosis not present

## 2019-05-02 MED ORDER — FREESTYLE LIBRE 14 DAY SENSOR MISC: 1.0000 | 6 refills | Status: DC

## 2019-05-02 MED ORDER — FREESTYLE LIBRE 14 DAY READER DEVI: 1.0000 | Freq: Three times a day (TID) | 1 refills | Status: DC

## 2019-05-02 NOTE — Progress Notes (Signed)
Virtual Visit via Video   I connected with patient on 05/02/19 at  9:00 AM EDT by a video enabled telemedicine application and verified that I am speaking with the correct person using two identifiers.  Location patient: Home Location provider: [facility]up[facility]ist, Office Persons participating in the virtual visit: Patient, Provider, [place] (Jess B)  I discussed the limitations of evaluation and management by telemedicine and the availability of in person appointments. The patient expressed understanding and agreed to proceed.  Subjective:   HPI:   CPE- UTD on colonoscopy, Tdap, flu.  UTD on eye exam, foot exam.  Pt has gained 11 lbs since last visit.  Pt has since resumed walking.  ROS:  Patient reports no vision/hearing changes, anorexia, fever ,adenopathy, persistant/recurrent hoarseness, swallowing issues, chest pain, palpitations, edema, persistant/recurrent cough, hemoptysis, dyspnea (rest,exertional, paroxysmal nocturnal), gastrointestinal  bleeding (melena, rectal bleeding), abdominal pain, excessive heart burn, GU symptoms (dysuria, hematuria, voiding/incontinence issues) syncope, focal weakness, memory loss, numbness & tingling, skin/hair/nail changes, depression, anxiety, abnormal bruising/bleeding, musculoskeletal symptoms/signs.   Patient Active Problem List   Diagnosis Date Noted  . Type 2 diabetes mellitus with hyperglycemia, without long-term current use of insulin ([place]) 02/11/2019  . Overweight (BMI 25.0-29.9) 03/20/2017  . Exertional chest pain 04/29/2013  . Diabetes mellitus without complication ([place]) 94/17/4081  . Erectile dysfunction 02/03/2013  . Physical exam, annual 01/29/2012  . HTN (hypertension) 04/12/2010  . MIGRAINE 10/05/2008  . RHINITIS 10/05/2008    Social History   Tobacco Use  . Smoking status: Never Smoker  . Smokeless tobacco: Never Used  Substance Use Topics  . Alcohol use: No    Alcohol/week: 0.0 standard drinks    Current Outpatient  Medications:  .  Blood Glucose Monitoring Suppl (CONTOUR NEXT ONE) KIT, 1 each by Does not apply route 2 (two) times daily as needed. Dx. E11.9, Disp: 1 kit, Rfl: 3 .  glucose blood (CONTOUR NEXT TEST) test strip, 1 each by Other route 2 (two) times daily as needed for other. Use as instructed, Disp: 50 each, Rfl: 12 .  hydrochlorothiazide (HYDRODIURIL) 25 MG tablet, Take 1 tablet (25 mg total) by mouth daily., Disp: 90 tablet, Rfl: 3 .  losartan (COZAAR) 100 MG tablet, Take 1 tablet (100 mg total) by mouth daily., Disp: 90 tablet, Rfl: 3 .  losartan-hydrochlorothiazide (HYZAAR) 100-25 MG tablet, TAKE 1 TABLET BY MOUTH DAILY, Disp: 90 tablet, Rfl: 1 .  metFORMIN (GLUCOPHAGE XR) 500 MG 24 hr tablet, Take 2 tablets (1,000 mg total) by mouth 2 (two) times daily with a meal. (Patient taking differently: Take 1,000 mg by mouth 2 (two) times a day. ), Disp: 180 tablet, Rfl: 11  No Known Allergies  Objective:   BP 122/76   Pulse 97   Ht 5' 9"  (1.753 m)   Wt 200 lb (90.7 kg)   BMI 29.53 kg/m   AAOx3, NAD NCAT, EOMI No obvious CN deficits Coloring WNL Pt is able to speak clearly, coherently without shortness of breath or increased work of breathing.  Thought process is linear.  Mood is appropriate.   Assessment and Plan:   CPE- UTD on colonoscopy, Tdap, flu.  Stressed need for healthy diet and regular exercise.  Pt will get labs done at [facility]  Anticipatory guidance provided.   DM- chronic problem.  UTD on foot exam, eye exam.  On ARB.  Pt has gained 11 lbs.  Stressed need for healthy diet and regular exercise.  Ordered Freestyle Libre system for pt due to  pain in fingers from checking sugars.   Annye Asa, MD 05/02/2019

## 2019-05-02 NOTE — Addendum Note (Signed)
Addended by: Doran Clay A on: 05/02/2019 09:44 AM   Modules accepted: Orders

## 2019-05-02 NOTE — Progress Notes (Signed)
I have discussed the procedure for the virtual visit with the patient who has given consent to proceed with assessment and treatment.   Glucose 122  Davis Gourd, CMA

## 2019-05-16 ENCOUNTER — Ambulatory Visit: Payer: BC Managed Care – PPO | Admitting: Internal Medicine

## 2019-05-16 NOTE — Progress Notes (Deleted)
Name: Lee Greene  Age/ Sex: 58 y.o., male   MRN/ DOB: [id number], June 20, 1961     PCP: Midge Minium, MD   Reason for Endocrinology Evaluation: Type 2 Diabetes Mellitus     Initial Endocrinology Clinic Visit: 10/01/2018    PATIENT IDENTIFIER: [name] is a 58 y.o. male with a past medical history of HTN and T2DM. The patient has followed with Endocrinology clinic since 10/01/2018 for consultative assistance with management of his diabetes.  DIABETIC HISTORY:  Lee Greene was diagnosed with T2DM in  2013. He was on oral glycemic agents since his diagnosis. His hemoglobin A1c has ranged from 6.7% in 2013, peaking at 17.4%  In 2020.  On his initial visit to our clinic his A1c was 17.4 %. He was on Metformin. He was started on Lantus in 09/2018 but stopped in 11/2018 Started Farxiga 11/2018 but this was also stopped by the patient due to intolerance with reported fatigue.   He works as an Mining engineer.  SUBJECTIVE:   During the last visit (02/11/2019): We continued Metformin. A1c 8.0%    Today (05/16/2019): Lee Greene is here for a 3 month  follow up on his diabetes management.  He checks his blood sugars 2 times daily, preprandial to breakfast and dinner. The patient has not had hypoglycemic episodes since the last clinic visit. Otherwise, the patient has not required any recent emergency interventions for hypoglycemia and has not had recent hospitalizations secondary to hyper or hypoglycemic episodes.     ROS: As per HPI and as detailed below: Review of Systems  Constitutional: Negative for fever.  Respiratory: Negative for cough and shortness of breath.   Cardiovascular: Negative for chest pain and palpitations.  Gastrointestinal: Negative for diarrhea and nausea.  Genitourinary: Negative for frequency.  Endo/Heme/Allergies: Negative for polydipsia.      HOME DIABETES REGIMEN:  Metformin 500 mg XR 2 Tabs BID     METER DOWNLOAD SUMMARY:  7/18-7/31/20 Fingerstick Blood Glucose Tests = 19 Average Number Tests/Day = 0.6 Overall Mean FS Glucose = 103   BG Ranges: Low = 88 High = 112   Hypoglycemic Events/30 Days: BG < 50 = 0 Episodes of symptomatic severe hypoglycemia = 0        HISTORY:  Past Medical History:  Past Medical History:  Diagnosis Date  . Chest pain    no issues since 2014   . Diabetes mellitus without complication ([place])    diet controlled, no medicines   . Hypertension    off BP medicines x 3 months as of 07-03-15   Past Surgical History:  Past Surgical History:  Procedure Laterality Date  . HAND SURGERY     1990's    Social History:  reports that he has never smoked. He has never used smokeless tobacco. He reports that he does not drink alcohol or use drugs. Family History:  Family History  Problem Relation Age of Onset  . Hypertension Father   . Diabetes Father   . Colon cancer Neg Hx   . Colon polyps Neg Hx   . Esophageal cancer Neg Hx   . Rectal cancer Neg Hx   . Stomach cancer Neg Hx      HOME MEDICATIONS: Allergies as of 05/16/2019   No Known Allergies     Medication List       Accurate as of May 16, 2019  7:25 AM. If you have any questions, ask your nurse or doctor.  Contour Next One Kit 1 each by Does not apply route 2 (two) times daily as needed. Dx. E11.9   Contour Next Test test strip Generic drug: glucose blood 1 each by Other route 2 (two) times daily as needed for other. Use as instructed   FreeStyle Libre 14 Day Reader Kerrin Mo 1 each by Does not apply route 3 (three) times daily. Use to test CGM as directed.  DX E11.9   FreeStyle Libre 14 Day Sensor Misc 1 each by Does not apply route every 14 (fourteen) days.   hydrochlorothiazide 25 MG tablet Commonly known as: HYDRODIURIL Take 1 tablet (25 mg total) by mouth daily.   losartan 100 MG tablet Commonly known as: COZAAR Take 1 tablet (100 mg total) by mouth daily.    losartan-hydrochlorothiazide 100-25 MG tablet Commonly known as: HYZAAR TAKE 1 TABLET BY MOUTH DAILY   metFORMIN 500 MG 24 hr tablet Commonly known as: Glucophage XR Take 2 tablets (1,000 mg total) by mouth 2 (two) times daily with a meal. What changed: when to take this       OBJECTIVE:   VITAL SIGNS:There were no vitals taken for this visit.  PHYSICAL EXAM:  General: Pt appears well and is in NAD  Neck: General: Supple without adenopathy or carotid bruits. Thyroid: Thyroid size normal.  No goiter or nodules appreciated. No thyroid bruit.  Lungs: Clear with good BS bilat with no rales, rhonchi, or wheezes  Heart: RRR with normal S1 and S2 and no gallops; no murmurs; no rub  Abdomen: Normoactive bowel sounds, soft, nontender, without masses or organomegaly palpable  Extremities:  Lower extremities - No pretibial edema. No lesions.  Skin: Normal texture and temperature to palpation. No rash noted.   Neuro: MS is good with appropriate affect, pt is alert and Ox3    DM foot exam: 10/01/18  The skin of the feet is intact without sores or ulcerations. The pedal pulses are 2+ on right and 2+ on left. The sensation is intact to a screening 5.07, 10 gram monofilament bilaterally   DATA REVIEWED:  Lab Results  Component Value Date   HGBA1C 8.0 (H) 01/03/2019   HGBA1C 17.4 (H) 09/28/2018   HGBA1C 8.7 (H) 04/07/2018   Lab Results  Component Value Date   MICROALBUR 1.5 02/03/2013   LDLCALC 52 01/03/2019   CREATININE 1.03 01/03/2019   Lab Results  Component Value Date   MICRALBCREAT 0.6 02/03/2013     Lab Results  Component Value Date   CHOL 152 01/03/2019   HDL 70.40 01/03/2019   LDLCALC 52 01/03/2019   LDLDIRECT 61.0 04/07/2018   TRIG 147.0 01/03/2019   CHOLHDL 2 01/03/2019         ASSESSMENT / PLAN / RECOMMENDATIONS:   1) Type 2 Diabetes Mellitus, poorly controlled, Without complications - Most recent A1c of 8.0 %. Goal A1c < 7.0 %.   Plan: - He has  done an amazing job with lifestyle changes with just metformin, I have congratulated him on this, but I also explained to him that the goal of an A1c is 7.0 % of less.  - He has been off lantus since may, did not require it with BG's running in the 100's.  - He was unable to tolerate [place] due to fatigue     MEDICATIONS:  Continue Metformin 500 mg 2 tabs BID  EDUCATION / INSTRUCTIONS:  BG monitoring instructions: Patient is instructed to check his blood sugars 2 times a day, fasting and bedtime.  Call [facility] [facility] if: BG persistently < 70 or > 300. . I reviewed the Rule of 15 for the treatment of hypoglycemia in detail with the patient. Literature supplied.       F/U in 3 months    Signed electronically by: Mack Guise, MD  [facility] End[facility]in[facility]  [facility] [address] [place], [place] [address] Phone: [phone] FAX: [phone]   CC: Midge Minium, MD [address] [place] [address] Phone: [phone]  Fax: [phone]  Return to Endocrinology clinic as below: Future Appointments  Date Time Provider [place]  05/16/2019  8:50 AM Shamleffer, Melanie Crazier, MD [facility] None  08/24/2019 10:30 AM Midge Minium, MD [facility]

## 2019-07-26 ENCOUNTER — Encounter (HOSPITAL_COMMUNITY): Payer: Self-pay

## 2019-07-26 ENCOUNTER — Emergency Department (HOSPITAL_COMMUNITY)
Admission: EM | Admit: 2019-07-26 | Discharge: 2019-07-26 | Disposition: A | Discharge status: Home / Self Care | Payer: 59 | Attending: Emergency Medicine | Admitting: Emergency Medicine

## 2019-07-26 ENCOUNTER — Other Ambulatory Visit: Payer: Self-pay

## 2019-07-26 ENCOUNTER — Emergency Department (HOSPITAL_COMMUNITY): Payer: 59

## 2019-07-26 DIAGNOSIS — Z79899 Other long term (current) drug therapy: Secondary | ICD-10-CM | POA: Insufficient documentation

## 2019-07-26 DIAGNOSIS — R059 Cough, unspecified: Secondary | ICD-10-CM

## 2019-07-26 DIAGNOSIS — R05 Cough: Secondary | ICD-10-CM

## 2019-07-26 DIAGNOSIS — U071 COVID-19: Secondary | ICD-10-CM | POA: Diagnosis not present

## 2019-07-26 DIAGNOSIS — Z7984 Long term (current) use of oral hypoglycemic drugs: Secondary | ICD-10-CM | POA: Insufficient documentation

## 2019-07-26 DIAGNOSIS — Z0184 Encounter for antibody response examination: Secondary | ICD-10-CM | POA: Insufficient documentation

## 2019-07-26 DIAGNOSIS — I10 Essential (primary) hypertension: Secondary | ICD-10-CM | POA: Insufficient documentation

## 2019-07-26 DIAGNOSIS — E119 Type 2 diabetes mellitus without complications: Secondary | ICD-10-CM | POA: Insufficient documentation

## 2019-07-26 LAB — POC SARS CORONAVIRUS 2 AG -  ED: SARS Coronavirus 2 Ag: POSITIVE — AB

## 2019-07-26 MED ORDER — BENZONATATE 100 MG PO CAPS: 100.0000 mg | ORAL_CAPSULE | Freq: Three times a day (TID) | ORAL | 0 refills | Status: DC

## 2019-07-26 NOTE — ED Notes (Signed)
Pt refused discharge vital signs

## 2019-07-26 NOTE — ED Notes (Signed)
Pt ambulated around room and SpO2 stayed within 92-95%

## 2019-07-26 NOTE — Discharge Instructions (Signed)
Please read and follow all provided instructions.  Your diagnoses today include:  1. COVID-19 virus infection   2. Cough     Tests performed today include:  Covid test - positive  Chest x-ray -no signs of pneumonia or other problems  Vital signs. See below for your results today.   Medications prescribed:   Tessalon Perles - cough suppressant medication  Take any prescribed medications only as directed.  Home care instructions:  Follow any educational materials contained in this packet.  You are diagnosed with a COVID-19 infection but you do not have severe pneumonia at this point.  There are no indications for admission to the hospital today and no medications to help your infection get better faster.  Continue to monitor your oxygen level and work of breathing.  If your oxygen level remains below 90% you develop significant shortness of breath or trouble breathing, you should return to the emergency department.  BE VERY CAREFUL not to take multiple medicines containing Tylenol (also called acetaminophen). Doing so can lead to an overdose which can damage your liver and cause liver failure and possibly death.   Follow-up instructions: Please follow-up with your primary care provider in the next 3 days for further evaluation of your symptoms.   Return instructions:   Please return to the Emergency Department if you experience worsening symptoms.   Please return if you have any other emergent concerns.  Additional Information:  Your vital signs today were: BP 129/79   Pulse 100   Temp 99.9 F (37.7 C) (Oral)   Resp 16   Wt 92.5 kg   SpO2 95%   BMI 30.13 kg/m  If your blood pressure (BP) was elevated above 135/85 this visit, please have this repeated by your doctor within one month. --------------

## 2019-07-26 NOTE — ED Triage Notes (Signed)
Pt states that he has had cough and SHOB x several days. Pt states that his oxygen levels have gotten down to 89-90% at home.

## 2019-07-26 NOTE — ED Provider Notes (Signed)
[facility] DEPT Provider Note   CSN: [id number] Arrival date & time: 07/26/19  1708     History Chief Complaint  Patient presents with  . Cough  . Shortness of Breath    Lee Greene is a 59 y.o. male.  Patient with history of diabetes, diet-controlled, hypertension presents to the emergency department with flulike illness over the past 9 or so days.  Patient has had significant cough and shortness of breath.  Denies fevers.  He can taste and smell.  No sick or coronavirus contacts.  His wife is sick in the emergency department with similar symptoms although her oxygen levels have been lower.  He reports oxygen levels around 90% at home.  No smoking history or history of COPD, asthma.  He denies chest pain or abdominal pain.  No nausea, vomiting, or diarrhea.  He had a coronavirus test through the health department today, results should be back tomorrow.  No history of blood clots or current lower extremity swelling or tenderness.        Past Medical History:  Diagnosis Date  . Chest pain    no issues since 2014   . Diabetes mellitus without complication ([place])    diet controlled, no medicines   . Hypertension    off BP medicines x 3 months as of 07-03-15    Patient Active Problem List   Diagnosis Date Noted  . Overweight (BMI 25.0-29.9) 03/20/2017  . Exertional chest pain 04/29/2013  . Diabetes mellitus without complication ([place]) 49/70/2637  . Erectile dysfunction 02/03/2013  . Physical exam, annual 01/29/2012  . HTN (hypertension) 04/12/2010  . MIGRAINE 10/05/2008  . RHINITIS 10/05/2008    Past Surgical History:  Procedure Laterality Date  . HAND SURGERY     1990's       Family History  Problem Relation Age of Onset  . Hypertension Father   . Diabetes Father   . Colon cancer Neg Hx   . Colon polyps Neg Hx   . Esophageal cancer Neg Hx   . Rectal cancer Neg Hx   . Stomach cancer Neg Hx     Social History   Tobacco  Use  . Smoking status: Never Smoker  . Smokeless tobacco: Never Used  Substance Use Topics  . Alcohol use: No    Alcohol/week: 0.0 standard drinks  . Drug use: No    Home Medications Prior to Admission medications   Medication Sig Start Date End Date Taking? Authorizing Provider  Blood Glucose Monitoring Suppl (CONTOUR NEXT ONE) KIT 1 each by Does not apply route 2 (two) times daily as needed. Dx. E11.9 02/03/19   Midge Minium, MD  Continuous Blood Gluc Receiver (FREESTYLE LIBRE 14 DAY READER) DEVI 1 each by Does not apply route 3 (three) times daily. Use to test CGM as directed.  DX E11.9 05/02/19   Midge Minium, MD  Continuous Blood Gluc Sensor (FREESTYLE LIBRE 14 DAY SENSOR) MISC 1 each by Does not apply route every 14 (fourteen) days. 05/02/19   Midge Minium, MD  glucose blood (CONTOUR NEXT TEST) test strip 1 each by Other route 2 (two) times daily as needed for other. Use as instructed 02/03/19   Midge Minium, MD  hydrochlorothiazide (HYDRODIURIL) 25 MG tablet Take 1 tablet (25 mg total) by mouth daily. 05/19/18   Midge Minium, MD  losartan (COZAAR) 100 MG tablet Take 1 tablet (100 mg total) by mouth daily. 05/19/18   Midge Minium,  MD  losartan-hydrochlorothiazide (HYZAAR) 100-25 MG tablet TAKE 1 TABLET BY MOUTH DAILY 01/05/19   Midge Minium, MD  metFORMIN (GLUCOPHAGE XR) 500 MG 24 hr tablet Take 2 tablets (1,000 mg total) by mouth 2 (two) times daily with a meal. Patient taking differently: Take 1,000 mg by mouth 2 (two) times a day.  10/01/18   Shamleffer, Melanie Crazier, MD    Allergies    Patient has no known allergies.  Review of Systems   Review of Systems  Constitutional: Positive for fatigue. Negative for chills and fever.  HENT: Negative for rhinorrhea and sore throat.   Eyes: Negative for redness.  Respiratory: Positive for cough and shortness of breath.   Cardiovascular: Negative for chest pain.  Gastrointestinal: Negative  for abdominal pain, diarrhea, nausea and vomiting.  Genitourinary: Negative for dysuria.  Musculoskeletal: Negative for myalgias.  Skin: Negative for rash.  Neurological: Negative for headaches.    Physical Exam Updated Vital Signs BP 129/79   Pulse 100   Temp 99.9 F (37.7 C) (Oral)   Resp 16   Wt 92.5 kg   SpO2 95%   BMI 30.13 kg/m   Physical Exam Vitals and nursing note reviewed.  Constitutional:      Appearance: He is well-developed.  HENT:     Head: Normocephalic and atraumatic.  Eyes:     General:        Right eye: No discharge.        Left eye: No discharge.     Conjunctiva/sclera: Conjunctivae normal.  Cardiovascular:     Rate and Rhythm: Normal rate and regular rhythm.     Heart sounds: Normal heart sounds.  Pulmonary:     Effort: Pulmonary effort is normal.     Breath sounds: Normal breath sounds. No decreased breath sounds.     Comments: Occasional cough with wheeze during exam, lungs otherwise clear. Abdominal:     Palpations: Abdomen is soft.     Tenderness: There is no abdominal tenderness.  Musculoskeletal:     Cervical back: Normal range of motion and neck supple.     Right lower leg: No tenderness. No edema.     Left lower leg: No tenderness. No edema.  Skin:    General: Skin is warm and dry.  Neurological:     Mental Status: He is alert.     ED Results / Procedures / Treatments   Labs (all labs ordered are listed, but only abnormal results are displayed) Labs Reviewed  POC SARS CORONAVIRUS 2 AG -  ED - Abnormal; Notable for the following components:      Result Value   SARS Coronavirus 2 Ag POSITIVE (*)    All other components within normal limits    EKG None  Radiology DG Chest 2 View  Result Date: 07/26/2019 CLINICAL DATA:  Cough, shortness of breath EXAM: CHEST - 2 VIEW COMPARISON:  08/16/2015 FINDINGS: Stable mild elevation of the right hemidiaphragm. Heart is borderline in size. No confluent opacities, effusions or edema. No  acute bony abnormality. IMPRESSION: Borderline heart size.  No active disease. Electronically Signed   By: Rolm Baptise M.D.   On: 07/26/2019 17:42    Procedures Procedures (including critical care time)  Medications Ordered in ED Medications - No data to display  ED Course  I have reviewed the triage vital signs and the nursing notes.  Pertinent labs & imaging results that were available during my care of the patient were reviewed by me and considered  in my medical decision making (see chart for details).  Patient seen and examined.  Will check a rapid coronavirus test.  Chest x-ray reviewed.  Will have patient ambulate to check oxygen level.  Vital signs reviewed and are as follows: BP 129/79   Pulse 100   Temp 99.9 F (37.7 C) (Oral)   Resp 16   Wt 92.5 kg   SpO2 95%   BMI 30.13 kg/m   8:49 PM patient ambulated and maintain pulse ox.  Plan for discharged home with Tessalon.  Detailed discussion had with with patient regarding COVID-19 precautions and written instructions given as well.  We discussed need to isolate themselves for 14 days from onset of symptoms and have 72 hours of improvement prior to breaking isolation.  We discussed signs symptoms to return which include worsening shortness of breath, trouble breathing, or increased work of breathing.  Also return with persistent vomiting, confusion, passing out, or if they have any other concerns. Counseled on the need for rest and good hydration. Discussed that high-risk contacts should be aware of positive result and they need to quarantine and be tested if they develop any symptoms. Patient verbalizes understanding.   Lee Greene was evaluated in Emergency Department on 07/26/2019 for the symptoms described in the history of present illness. He was evaluated in the context of the global COVID-19 pandemic, which necessitated consideration that the patient might be at risk for infection with the SARS-CoV-2 virus that  causes COVID-19. Institutional protocols and algorithms that pertain to the evaluation of patients at risk for COVID-19 are in a state of rapid change based on information released by regulatory bodies including the CDC and federal and state organizations. These policies and algorithms were followed during the patient's care in the ED.     MDM Rules/Calculators/A&P                      Patient with COVID-19 virus infection, and negative x-ray.  He has ambulated without oxygen desaturation and is not requiring oxygen.  His symptoms are controlled enough to the point where he can go home and self isolate.  We discussed signs and symptoms to return as above.   Final Clinical Impression(s) / ED Diagnoses Final diagnoses:  [id number] virus infection  Cough    Rx / DC Orders ED Discharge Orders    None       Carlisle Cater, PA-C 07/26/19 2050    Deno Etienne, DO 07/26/19 2240

## 2019-07-28 ENCOUNTER — Encounter (HOSPITAL_COMMUNITY): Payer: Self-pay | Admitting: Emergency Medicine

## 2019-07-28 ENCOUNTER — Emergency Department (HOSPITAL_COMMUNITY)
Admission: EM | Admit: 2019-07-28 | Discharge: 2019-07-28 | Disposition: A | Discharge status: Home / Self Care | Payer: 59 | Attending: Emergency Medicine | Admitting: Emergency Medicine

## 2019-07-28 ENCOUNTER — Other Ambulatory Visit: Payer: Self-pay

## 2019-07-28 DIAGNOSIS — I10 Essential (primary) hypertension: Secondary | ICD-10-CM | POA: Insufficient documentation

## 2019-07-28 DIAGNOSIS — R0602 Shortness of breath: Secondary | ICD-10-CM | POA: Diagnosis present

## 2019-07-28 DIAGNOSIS — Z7984 Long term (current) use of oral hypoglycemic drugs: Secondary | ICD-10-CM | POA: Diagnosis not present

## 2019-07-28 DIAGNOSIS — Z79899 Other long term (current) drug therapy: Secondary | ICD-10-CM | POA: Insufficient documentation

## 2019-07-28 DIAGNOSIS — U071 COVID-19: Secondary | ICD-10-CM | POA: Insufficient documentation

## 2019-07-28 DIAGNOSIS — E119 Type 2 diabetes mellitus without complications: Secondary | ICD-10-CM | POA: Diagnosis not present

## 2019-07-28 NOTE — ED Triage Notes (Signed)
Pt Covid+, home O2 reading showing in 80s on room air. Pt using inhaler that was given yesterday by PCP. Reports headache.

## 2019-07-28 NOTE — ED Provider Notes (Signed)
[facility] DEPT Provider Note   CSN: [id number] Arrival date & time: 07/28/19  1434     History Chief Complaint  Patient presents with  . Covid positive    Lee Greene is a 59 y.o. male with PMHx HTN (controlled without medication) Diabetes (on metformin) who presents to the ED today for further evaluation.  Patient reports he recently tested positive for COVID-19 and has been checking his home oxygen saturation readings which he states has been 89 to 90%.  He states that he was told by his PCP to come to the ED for further evaluation.  Patient reports he is about 6 days out from having symptoms.  He states both him and his wife have tested +2 days ago while they were seen in the ED on 1/12.  Patient had a chest x-ray at that time which was negative.  His oxygen saturation at 95% did not desaturate with ambulation and he was discharged home.  Patient states he has been using cough medication as well as an albuterol inhaler with mild relief.  Reports that his wife has been doing much worse than him and her oxygen saturations have been in the low 80s.  On arrival to the ED patient's temp is 99, his pulse is 96, his oxygen saturation is 93% on room air.  Patient states he feels short of breath but denies any chest pain.  No diarrhea.  No other complaints at this time. No hx of DVT/PE. No complaint of leg swelling/calf pain.   The history is provided by the patient and medical records.       Past Medical History:  Diagnosis Date  . Chest pain    no issues since 2014   . Diabetes mellitus without complication ([place])    diet controlled, no medicines   . Hypertension    off BP medicines x 3 months as of 07-03-15    Patient Active Problem List   Diagnosis Date Noted  . Overweight (BMI 25.0-29.9) 03/20/2017  . Exertional chest pain 04/29/2013  . Diabetes mellitus without complication ([place]) 84/66/5993  . Erectile dysfunction 02/03/2013  . Physical  exam, annual 01/29/2012  . HTN (hypertension) 04/12/2010  . MIGRAINE 10/05/2008  . RHINITIS 10/05/2008    Past Surgical History:  Procedure Laterality Date  . HAND SURGERY     1990's       Family History  Problem Relation Age of Onset  . Hypertension Father   . Diabetes Father   . Colon cancer Neg Hx   . Colon polyps Neg Hx   . Esophageal cancer Neg Hx   . Rectal cancer Neg Hx   . Stomach cancer Neg Hx     Social History   Tobacco Use  . Smoking status: Never Smoker  . Smokeless tobacco: Never Used  Substance Use Topics  . Alcohol use: No    Alcohol/week: 0.0 standard drinks  . Drug use: No    Home Medications Prior to Admission medications   Medication Sig Start Date End Date Taking? Authorizing Provider  benzonatate (TESSALON) 100 MG capsule Take 1 capsule (100 mg total) by mouth every 8 (eight) hours. 07/26/19  Yes Carlisle Cater, PA-C  losartan-hydrochlorothiazide (HYZAAR) 100-25 MG tablet TAKE 1 TABLET BY MOUTH DAILY 01/05/19  Yes Midge Minium, MD  metFORMIN (GLUCOPHAGE XR) 500 MG 24 hr tablet Take 2 tablets (1,000 mg total) by mouth 2 (two) times daily with a meal. Patient taking differently: Take 1,000 mg by  mouth 2 (two) times a day.  10/01/18  Yes Shamleffer, Melanie Crazier, MD  Blood Glucose Monitoring Suppl (CONTOUR NEXT ONE) KIT 1 each by Does not apply route 2 (two) times daily as needed. Dx. E11.9 02/03/19   Midge Minium, MD  Continuous Blood Gluc Receiver (FREESTYLE LIBRE 14 DAY READER) DEVI 1 each by Does not apply route 3 (three) times daily. Use to test CGM as directed.  DX E11.9 05/02/19   Midge Minium, MD  Continuous Blood Gluc Sensor (FREESTYLE LIBRE 14 DAY SENSOR) MISC 1 each by Does not apply route every 14 (fourteen) days. 05/02/19   Midge Minium, MD  glucose blood (CONTOUR NEXT TEST) test strip 1 each by Other route 2 (two) times daily as needed for other. Use as instructed 02/03/19   Midge Minium, MD    hydrochlorothiazide (HYDRODIURIL) 25 MG tablet Take 1 tablet (25 mg total) by mouth daily. Patient not taking: Reported on 07/28/2019 05/19/18   Midge Minium, MD  losartan (COZAAR) 100 MG tablet Take 1 tablet (100 mg total) by mouth daily. Patient not taking: Reported on 07/28/2019 05/19/18   Midge Minium, MD    Allergies    Patient has no known allergies.  Review of Systems   Review of Systems  Constitutional: Positive for chills, fatigue and fever.  Respiratory: Positive for cough and shortness of breath.   Cardiovascular: Negative for chest pain.  Gastrointestinal: Negative for diarrhea.  All other systems reviewed and are negative.   Physical Exam Updated Vital Signs BP 135/90 (BP Location: Left Arm)   Pulse 96   Temp 99 F (37.2 C) (Oral)   Resp 18   SpO2 93%   Physical Exam Vitals and nursing note reviewed.  Constitutional:      Appearance: He is not ill-appearing or diaphoretic.  HENT:     Head: Normocephalic and atraumatic.  Eyes:     Conjunctiva/sclera: Conjunctivae normal.  Cardiovascular:     Rate and Rhythm: Normal rate and regular rhythm.     Pulses: Normal pulses.  Pulmonary:     Effort: Pulmonary effort is normal.     Breath sounds: Normal breath sounds. No wheezing, rhonchi or rales.     Comments: Actively coughing in room. Satting 93% on RA. 91% on RA with ambulation. Able to speak in full sentences without difficulty.  Chest:     Chest wall: No tenderness.  Abdominal:     Palpations: Abdomen is soft.     Tenderness: There is no abdominal tenderness. There is no guarding or rebound.  Musculoskeletal:     Cervical back: Neck supple.  Skin:    General: Skin is warm and dry.  Neurological:     Mental Status: He is alert.     ED Results / Procedures / Treatments   Labs (all labs ordered are listed, but only abnormal results are displayed) Labs Reviewed - No data to display  EKG None  Radiology DG Chest 2 View  Result Date:  07/26/2019 CLINICAL DATA:  Cough, shortness of breath EXAM: CHEST - 2 VIEW COMPARISON:  08/16/2015 FINDINGS: Stable mild elevation of the right hemidiaphragm. Heart is borderline in size. No confluent opacities, effusions or edema. No acute bony abnormality. IMPRESSION: Borderline heart size.  No active disease. Electronically Signed   By: Rolm Baptise M.D.   On: 07/26/2019 17:42    Procedures Procedures (including critical care time)  Medications Ordered in ED Medications - No data to display  ED Course  I have reviewed the triage vital signs and the nursing notes.  Pertinent labs & imaging results that were available during my care of the patient were reviewed by me and considered in my medical decision making (see chart for details).  59 year old male presents the ED today with Covid positive status, reevaluation for oxygen saturation as he reports it has been around 89 to 90% on room air at home.  On arrival patient's oxygen saturation 93%.  I personally ambulated him in his room and he stated 91% on room air.  Patient is actively coughing in the room but is able to speak in full sentences without accessory muscle use.  He was seen in the ED 2 days ago and had a chest x-ray which was negative at that time.  Do not feel he needs repeat chest x-ray today. Pt without complaints of diarrhea, nausea, vomiting. Do not feel he needs labwork today as I do not expect electrolyte derangements. Pt to be discharged home at this time - he has an albuterol inhaler to use. He is unfortunately out of the window for monoclonal antibody infusion as he is on day 6 currently. Pt advised to continue monitoring his symptoms at home and if his oxygen saturation is continuously in the mid 80's he will need to return at that time as he may require oxygen/admission. Pt is in agreement with plan and stable for discharge home.   This note was prepared using Dragon voice recognition software and may include unintentional  dictation errors due to the inherent limitations of voice recognition software.  Lee Greene was evaluated in Emergency Department on 07/28/2019 for the symptoms described in the history of present illness. He was evaluated in the context of the global COVID-19 pandemic, which necessitated consideration that the patient might be at risk for infection with the SARS-CoV-2 virus that causes COVID-19. Institutional protocols and algorithms that pertain to the evaluation of patients at risk for COVID-19 are in a state of rapid change based on information released by regulatory bodies including the CDC and federal and state organizations. These policies and algorithms were followed during the patient's care in the ED.     MDM Rules/Calculators/A&P                       Final Clinical Impression(s) / ED Diagnoses Final diagnoses:  COVID-19  Shortness of breath    Rx / DC Orders ED Discharge Orders    None       Discharge Instructions     Continue monitoring your symptoms at home including checking your pulse ox regularly. If it stays in the mid 80's continuously you will need to return to the ED ASAP for further evaluation.   Please return to the ED IMMEDIATELY for any worsening symptoms including worsening shortness of breath, chest pain, leg swelling/pain, diarrhea, inability to tolerate fluids, dizziness/lightheadedness.   Continue using your albuterol inhaler as needed. Take Tylenol as needed for fevers.   Follow up with your PCP.        Eustaquio Maize, PA-C 07/28/19 1558    Carmin Muskrat, MD 07/28/19 [phone]

## 2019-07-28 NOTE — Discharge Instructions (Signed)
Continue monitoring your symptoms at home including checking your pulse ox regularly. If it stays in the mid 80's continuously you will need to return to the ED ASAP for further evaluation.   Please return to the ED IMMEDIATELY for any worsening symptoms including worsening shortness of breath, chest pain, leg swelling/pain, diarrhea, inability to tolerate fluids, dizziness/lightheadedness.   Continue using your albuterol inhaler as needed. Take Tylenol as needed for fevers.   Follow up with your PCP.

## 2019-08-15 ENCOUNTER — Telehealth: Payer: Self-pay

## 2019-08-15 MED ORDER — HYDROCODONE-HOMATROPINE 5-1.5 MG/5ML PO SYRP: 5.0000 mL | ORAL_SOLUTION | Freq: Three times a day (TID) | ORAL | 0 refills | Status: DC | PRN

## 2019-08-15 NOTE — Telephone Encounter (Signed)
[facility]  Patient Name: BRASHAUN SALTZMAN Gender: Male DOB: 1960/10/09 Age: 59 Y 54 M 26 D Return Phone Number: [phone] (Primary) Address: City/State/Zip: [place] [address] Client [facility] Night - C Client Site [place] - Night Contact Type Call Who Is Calling Patient / Member / Family / Caregiver Call Type Triage / Clinical Relationship To Patient Self Return Phone Number [phone] (Primary) Chief Complaint Prescription Refill or Medication Request (non symptomatic) Reason for Call Symptomatic / Request for [place] states he is positive for COVID and needs his cough medication refilled. Hydrocodone [place] Suppression. Translation No Nurse Assessment Guidelines Guideline Title Affirmed Question Affirmed Notes Nurse Date/Time (Eastern Time) Disp. Time Eilene Ghazi Time) Disposition Final User 08/12/2019 6:21:39 PM Send To RN Personal Bryson Ha 08/12/2019 6:35:39 PM Attempt made - message left McClarnon, RN, [facility] 08/12/2019 6:53:43 PM FINAL ATTEMPT MADE - message left Yes McClarnon, RN, Earnest Bailey

## 2019-08-15 NOTE — Telephone Encounter (Signed)
Called and spoke with pt. He advised that he had been taking his wife's cough medication. It was the only thing that had helped his symptoms. He is still having SOB with exertion and uncontrollable cough. He did check his O2 while on the phone, he walked around his living room for me and O2 was 93%

## 2019-08-15 NOTE — Telephone Encounter (Signed)
Cough syrup filled at pt's request

## 2019-08-15 NOTE — Addendum Note (Signed)
Addended by: Midge Minium on: 08/15/2019 09:00 AM   Modules accepted: Orders

## 2019-08-15 NOTE — Telephone Encounter (Signed)
Can we please check on Lee Greene?  I am confused by the after hours note and I'm not sure if he is still struggling w/ symptoms or not

## 2019-08-24 ENCOUNTER — Other Ambulatory Visit: Payer: Self-pay

## 2019-08-24 ENCOUNTER — Encounter: Payer: Self-pay | Admitting: Family Medicine

## 2019-08-24 ENCOUNTER — Ambulatory Visit (INDEPENDENT_AMBULATORY_CARE_PROVIDER_SITE_OTHER): Payer: 59 | Admitting: Family Medicine

## 2019-08-24 VITALS — HR 108 | Temp 97.7°F | Ht 68.0 in | Wt 199.0 lb

## 2019-08-24 DIAGNOSIS — E119 Type 2 diabetes mellitus without complications: Secondary | ICD-10-CM

## 2019-08-24 DIAGNOSIS — I10 Essential (primary) hypertension: Secondary | ICD-10-CM

## 2019-08-24 DIAGNOSIS — Z8616 Personal history of COVID-19: Secondary | ICD-10-CM | POA: Diagnosis not present

## 2019-08-24 NOTE — Progress Notes (Signed)
Virtual Visit via Video   I connected with patient on 08/24/19 at 10:30 AM EST by a video enabled telemedicine application and verified that I am speaking with the correct person using two identifiers.  Location patient: Home Location provider: Acup[facility]ist, Office Persons participating in the virtual visit: Patient, Provider, [place] (Jess B)  I discussed the limitations of evaluation and management by telemedicine and the availability of in person appointments. The patient expressed understanding and agreed to proceed.  Subjective:   HPI:   DM- chronic problem, on Metformin XR 1000mg  BID.  Did not come for labs in October.  Pt is using Libre meter and states CBGs 'are pretty good'  98 last night prior to eating.  AM 77.  Denies symptomatic lows.  UTD on foot exam, eye exam.  HTN- chronic problem, on Losartan HCTZ 100/25mg  daily.  Unable to check BP at home.  COVID- pt tested + early January.  Pt reports he is doing 'a lot better'.  Continues to have SOB but reports cough is improving.  Pt reports chest tightness w/ deep breath- which triggers cough.  Pt reports being 'pretty winded' w/ stairs.  Pt would like to give his recovery more time prior to pulmonary referral.  + fatigue.  ROS:   See pertinent positives and negatives per HPI.  Patient Active Problem List   Diagnosis Date Noted  . Overweight (BMI 25.0-29.9) 03/20/2017  . Exertional chest pain 04/29/2013  . Diabetes mellitus without complication ([place]) 123XX123  . Erectile dysfunction 02/03/2013  . Physical exam, annual 01/29/2012  . HTN (hypertension) 04/12/2010  . MIGRAINE 10/05/2008  . RHINITIS 10/05/2008    Social History   Tobacco Use  . Smoking status: Never Smoker  . Smokeless tobacco: Never Used  Substance Use Topics  . Alcohol use: No    Alcohol/week: 0.0 standard drinks    Current Outpatient Medications:  .  Continuous Blood Gluc Receiver (FREESTYLE LIBRE 14 DAY READER) DEVI, 1 each by Does not  apply route 3 (three) times daily. Use to test CGM as directed.  DX E11.9, Disp: 1 each, Rfl: 1 .  Continuous Blood Gluc Sensor (FREESTYLE LIBRE 14 DAY SENSOR) MISC, 1 each by Does not apply route every 14 (fourteen) days., Disp: 3 each, Rfl: 6 .  losartan-hydrochlorothiazide (HYZAAR) 100-25 MG tablet, TAKE 1 TABLET BY MOUTH DAILY, Disp: 90 tablet, Rfl: 1 .  metFORMIN (GLUCOPHAGE XR) 500 MG 24 hr tablet, Take 2 tablets (1,000 mg total) by mouth 2 (two) times daily with a meal. (Patient taking differently: Take 1,000 mg by mouth 2 (two) times a day. ), Disp: 180 tablet, Rfl: 11  No Known Allergies  Objective:   Pulse (!) 108   Temp 97.7 F (36.5 C) (Oral)   Ht 5\' 8"  (1.727 m)   Wt 199 lb (90.3 kg)   SpO2 99%   BMI 30.26 kg/m   AAOx3, NAD NCAT, EOMI No obvious CN deficits Coloring WNL Pt is able to speak clearly, coherently without shortness of breath or increased work of breathing.  1 episode of dry cough Thought process is linear.  Mood is appropriate.   Assessment and Plan:   DM- chronic problem.  Pt reports home CBGs are well controlled.  Did not come for labs in October so no recent A1C.  UTD on foot exam, eye exam.  On ARB for renal protection.  Check labs.  Adjust meds prn   HTN- chronic problem.  Unable to check BP at home so  will assess when he comes for labs.  COVID- pt is struggling w/ continued SOB.  Cough is improving but he is still markedly short of breath w/ climbing stairs.  Offered pulmonary referral but pt prefers to give it a few more weeks to see if things improve.  Will continue to follow closely.   Annye Asa, MD 08/24/2019

## 2019-08-24 NOTE — Progress Notes (Signed)
I have discussed the procedure for the virtual visit with the patient who has given consent to proceed with assessment and treatment.   Lee Greene L [name], CMA     

## 2019-08-29 ENCOUNTER — Other Ambulatory Visit: Payer: Self-pay

## 2019-08-29 ENCOUNTER — Telehealth: Payer: Self-pay | Admitting: Family Medicine

## 2019-08-29 NOTE — Telephone Encounter (Signed)
LM for pt to call back to schedule a lab appt at anytime now and a 3-8mth reck DM from appt on 08/24/19

## 2019-09-02 ENCOUNTER — Ambulatory Visit: Payer: 59

## 2019-09-02 ENCOUNTER — Other Ambulatory Visit: Payer: Self-pay

## 2019-09-02 MED ORDER — LOSARTAN POTASSIUM-HCTZ 100-25 MG PO TABS: 1.0000 | ORAL_TABLET | Freq: Every day | ORAL | 1 refills | Status: DC

## 2019-09-07 ENCOUNTER — Other Ambulatory Visit: Payer: Self-pay

## 2019-09-07 ENCOUNTER — Ambulatory Visit (INDEPENDENT_AMBULATORY_CARE_PROVIDER_SITE_OTHER): Payer: 59 | Admitting: *Deleted

## 2019-09-07 DIAGNOSIS — Z125 Encounter for screening for malignant neoplasm of prostate: Secondary | ICD-10-CM

## 2019-09-07 DIAGNOSIS — E119 Type 2 diabetes mellitus without complications: Secondary | ICD-10-CM | POA: Diagnosis not present

## 2019-09-07 LAB — HEPATIC FUNCTION PANEL
ALT: 24 U/L (ref 0–53)
AST: 16 U/L (ref 0–37)
Albumin: 4.4 g/dL (ref 3.5–5.2)
Alkaline Phosphatase: 64 U/L (ref 39–117)
Bilirubin, Direct: 0.2 mg/dL (ref 0.0–0.3)
Total Bilirubin: 1.3 mg/dL — ABNORMAL HIGH (ref 0.2–1.2)
Total Protein: 7.3 g/dL (ref 6.0–8.3)

## 2019-09-07 LAB — CBC WITH DIFFERENTIAL/PLATELET
Basophils Absolute: 0.1 10*3/uL (ref 0.0–0.1)
Basophils Relative: 0.6 % (ref 0.0–3.0)
Eosinophils Absolute: 0.2 10*3/uL (ref 0.0–0.7)
Eosinophils Relative: 2.7 % (ref 0.0–5.0)
HCT: 47.9 % (ref 39.0–52.0)
Hemoglobin: 15.5 g/dL (ref 13.0–17.0)
Lymphocytes Relative: 38.6 % (ref 12.0–46.0)
Lymphs Abs: 3.5 10*3/uL (ref 0.7–4.0)
MCHC: 32.3 g/dL (ref 30.0–36.0)
MCV: 80.9 fl (ref 78.0–100.0)
Monocytes Absolute: 0.8 10*3/uL (ref 0.1–1.0)
Monocytes Relative: 9.2 % (ref 3.0–12.0)
Neutro Abs: 4.4 10*3/uL (ref 1.4–7.7)
Neutrophils Relative %: 48.9 % (ref 43.0–77.0)
Platelets: 262 10*3/uL (ref 150.0–400.0)
RBC: 5.92 Mil/uL — ABNORMAL HIGH (ref 4.22–5.81)
RDW: 14.4 % (ref 11.5–15.5)
WBC: 9 10*3/uL (ref 4.0–10.5)

## 2019-09-07 LAB — BASIC METABOLIC PANEL
BUN: 15 mg/dL (ref 6–23)
CO2: 28 mEq/L (ref 19–32)
Calcium: 9.9 mg/dL (ref 8.4–10.5)
Chloride: 101 mEq/L (ref 96–112)
Creatinine, Ser: 1.09 mg/dL (ref 0.40–1.50)
GFR: 84.55 mL/min (ref >60.00)
Glucose, Bld: 138 mg/dL — ABNORMAL HIGH (ref 70–99)
Potassium: 3.9 mEq/L (ref 3.5–5.1)
Sodium: 140 mEq/L (ref 135–145)

## 2019-09-07 LAB — LIPID PANEL
Cholesterol: 144 mg/dL (ref 0–200)
HDL: 64.7 mg/dL (ref >39.00)
LDL Cholesterol: 43 mg/dL (ref 0–99)
NonHDL: 79.11
Total CHOL/HDL Ratio: 2
Triglycerides: 183 mg/dL — ABNORMAL HIGH (ref 0.0–149.0)
VLDL: 36.6 mg/dL (ref 0.0–40.0)

## 2019-09-07 LAB — TSH: TSH: 1.96 u[IU]/mL (ref 0.35–4.50)

## 2019-09-07 LAB — PSA: PSA: 0.69 ng/mL (ref 0.10–4.00)

## 2019-09-08 LAB — HEMOGLOBIN A1C: Hgb A1c MFr Bld: 7.3 % — ABNORMAL HIGH (ref 4.6–6.5)

## 2019-09-09 ENCOUNTER — Telehealth: Payer: Self-pay

## 2019-09-09 ENCOUNTER — Encounter: Payer: Self-pay | Admitting: General Practice

## 2019-09-09 NOTE — Telephone Encounter (Signed)
It is a rather gray area but it is recommended you wait between 45-90 days for vaccination b/c of the natural immunity having the virus provides.  I would schedule closer to 45 so that the natural immunity doesn't wear off before you are able to get the vaccine

## 2019-09-09 NOTE — Telephone Encounter (Signed)
Patient called in to find out when he could get the covid vaccine since he had covid last month. Is there a waiting period. Please advise.

## 2019-09-09 NOTE — Telephone Encounter (Signed)
Spoke with patient. Patient voiced understanding.

## 2019-11-30 ENCOUNTER — Telehealth: Payer: Self-pay

## 2019-11-30 NOTE — Telephone Encounter (Signed)
Patient believe that he might have a possible eye infection.Patient states that Dr. Birdie Riddle have seen him in the pass for this issue. Transferred patient over to the Triage nurse.

## 2019-11-30 NOTE — Telephone Encounter (Signed)
FYI

## 2019-12-01 ENCOUNTER — Encounter: Payer: Self-pay | Admitting: Family Medicine

## 2019-12-01 ENCOUNTER — Other Ambulatory Visit: Payer: Self-pay

## 2019-12-01 ENCOUNTER — Ambulatory Visit: Payer: 59 | Admitting: Family Medicine

## 2019-12-01 VITALS — BP 120/76 | HR 80 | Temp 97.9°F | Resp 16 | Ht 68.0 in | Wt 210.0 lb

## 2019-12-01 DIAGNOSIS — H1032 Unspecified acute conjunctivitis, left eye: Secondary | ICD-10-CM | POA: Diagnosis not present

## 2019-12-01 MED ORDER — POLYMYXIN B-TRIMETHOPRIM 10000-0.1 UNIT/ML-% OP SOLN: 2.0000 [drp] | Freq: Three times a day (TID) | OPHTHALMIC | 0 refills | Status: DC

## 2019-12-01 NOTE — Patient Instructions (Signed)
Follow up in 1 month to recheck diabetes Use the Polytrim eye drops- 2 drops 3x/day- for no more than 7 days IF the R eye develops similar symptoms, you can use the drops in that eye too Try and avoid rubbing Call with any questions or concerns Have a great weekend!

## 2019-12-01 NOTE — Progress Notes (Signed)
   Subjective:    Patient ID: Lee Greene, male    DOB: 01/01/1961, 59 y.o.   MRN: [id number]  HPI Eye issue- L eye.  Friday he cut the grass, Saturday eye started watering.  Then eye developed redness, swelling, 'pus would come out'.  Not painful.  Has 'gritty' 'sandy' feeling.  No issues w/ R eye.  No visual changes unless film build up.   Review of Systems For ROS see HPI   This visit occurred during the SARS-CoV-2 public health emergency.  Safety protocols were in place, including screening questions prior to the visit, additional usage of staff PPE, and extensive cleaning of exam room while observing appropriate contact time as indicated for disinfecting solutions.       Objective:   Physical Exam Vitals reviewed.  Constitutional:      Appearance: Normal appearance. He is obese.  HENT:     Head: Normocephalic and atraumatic.  Eyes:     General:        Left eye: Discharge present.    Pupils: Pupils are equal, round, and reactive to light.     Comments: R conjunctiva normal, L conjunctiva injected w/ limbic sparing  Neurological:     Mental Status: He is alert.           Assessment & Plan:  Conjunctivitis- new.  Start Polytrim eye drops as sxs have worsened as the week has gone on.  Reviewed supportive care and red flags that should prompt return.  Pt expressed understanding and is in agreement w/ plan.

## 2019-12-02 ENCOUNTER — Encounter: Payer: Self-pay | Admitting: Physician Assistant

## 2019-12-02 ENCOUNTER — Ambulatory Visit: Payer: 59 | Admitting: Physician Assistant

## 2019-12-02 VITALS — BP 130/84 | HR 84 | Temp 97.3°F | Resp 16 | Ht 68.0 in | Wt 209.0 lb

## 2019-12-02 DIAGNOSIS — H60391 Other infective otitis externa, right ear: Secondary | ICD-10-CM | POA: Diagnosis not present

## 2019-12-02 MED ORDER — CIPROFLOXACIN HCL 500 MG PO TABS: 500.0000 mg | ORAL_TABLET | Freq: Two times a day (BID) | ORAL | 0 refills | Status: DC

## 2019-12-02 MED ORDER — CIPROFLOXACIN-DEXAMETHASONE 0.3-0.1 % OT SUSP: OTIC | 0 refills | Status: DC

## 2019-12-02 NOTE — Progress Notes (Signed)
Patient presents to clinic today c/o 1 day of right ear pain first noted early this morning, waking him from sleep.  Patient notes a deep throbbing pain that is sometimes sharp.  Is affected by moving jaw but denies any dental pain or injury.  Denies nasal congestion, cough or chest congestion.  Denies any ear pressure or popping.  Denies drainage from the ear.  Denies tinnitus.  Patient also denies fever, chills, malaise or fatigue.  Is currently being treated for bacterial conjunctivitis of left eye.Marland Kitchen   Past Medical History:  Diagnosis Date  . Chest pain    no issues since 2014   . Diabetes mellitus without complication ([place])    diet controlled, no medicines   . Hypertension    off BP medicines x 3 months as of 07-03-15    Current Outpatient Medications on File Prior to Visit  Medication Sig Dispense Refill  . Continuous Blood Gluc Receiver (FREESTYLE LIBRE 14 DAY READER) DEVI 1 each by Does not apply route 3 (three) times daily. Use to test CGM as directed.  DX E11.9 1 each 1  . Continuous Blood Gluc Sensor (FREESTYLE LIBRE 14 DAY SENSOR) MISC 1 each by Does not apply route every 14 (fourteen) days. 3 each 6  . losartan-hydrochlorothiazide (HYZAAR) 100-25 MG tablet Take 1 tablet by mouth daily. 90 tablet 1  . metFORMIN (GLUCOPHAGE XR) 500 MG 24 hr tablet Take 2 tablets (1,000 mg total) by mouth 2 (two) times daily with a meal. (Patient taking differently: Take 1,000 mg by mouth 2 (two) times a day. ) 180 tablet 11  . trimethoprim-polymyxin b (POLYTRIM) ophthalmic solution Place 2 drops into the left eye 3 (three) times daily. Use no longer than 7 days 10 mL 0   No current facility-administered medications on file prior to visit.    No Known Allergies  Family History  Problem Relation Age of Onset  . Hypertension Father   . Diabetes Father   . Colon cancer Neg Hx   . Colon polyps Neg Hx   . Esophageal cancer Neg Hx   . Rectal cancer Neg Hx   . Stomach cancer Neg Hx      Social History   Socioeconomic History  . Marital status: Married    Spouse name: Not on file  . Number of children: Not on file  . Years of education: Not on file  . Highest education level: Not on file  Occupational History  . Not on file  Tobacco Use  . Smoking status: Never Smoker  . Smokeless tobacco: Never Used  Substance and Sexual Activity  . Alcohol use: No    Alcohol/week: 0.0 standard drinks  . Drug use: No  . Sexual activity: Yes  Other Topics Concern  . Not on file  Social History Narrative  . Not on file   Social Determinants of Health   Financial Resource Strain:   . Difficulty of Paying Living Expenses:   Food Insecurity:   . Worried About Charity fundraiser in the Last Year:   . Arboriculturist in the Last Year:   Transportation Needs:   . Film/video editor (Medical):   Marland Kitchen Lack of Transportation (Non-Medical):   Physical Activity:   . Days of Exercise per Week:   . Minutes of Exercise per Session:   Stress:   . Feeling of Stress :   Social Connections:   . Frequency of Communication with Friends and Family:   .  Frequency of Social Gatherings with Friends and Family:   . Attends Religious Services:   . Active Member of Clubs or Organizations:   . Attends Archivist Meetings:   Marland Kitchen Marital Status:     Review of Systems - See HPI.  All other ROS are negative.  Wt 209 lb (94.8 kg)   BMI 31.78 kg/m   Physical Exam Vitals reviewed.  Constitutional:      Appearance: Normal appearance.  HENT:     Head: Normocephalic and atraumatic.     Right Ear: Tympanic membrane normal. Swelling and tenderness present. No middle ear effusion. There is no impacted cerumen. No foreign body. No mastoid tenderness. Tympanic membrane is not erythematous.     Left Ear: Tympanic membrane and ear canal normal.     Mouth/Throat:     Mouth: Mucous membranes are moist.  Eyes:     Conjunctiva/sclera: Conjunctivae normal.     Pupils: Pupils are equal,  round, and reactive to light.  Cardiovascular:     Rate and Rhythm: Normal rate and regular rhythm.     Pulses: Normal pulses.     Heart sounds: Normal heart sounds.  Pulmonary:     Effort: Pulmonary effort is normal.  Musculoskeletal:     Cervical back: Neck supple.  Neurological:     General: No focal deficit present.     Mental Status: He is alert and oriented to person, place, and time.  Psychiatric:        Mood and Affect: Mood normal.     Recent Results (from the past 2160 hour(s))  CBC with Differential/Platelet     Status: Abnormal   Collection Time: 09/07/19  9:33 AM  Result Value Ref Range   WBC 9.0 4.0 - 10.5 K/uL   RBC 5.92 (H) 4.22 - 5.81 Mil/uL   Hemoglobin 15.5 13.0 - 17.0 g/dL   HCT 47.9 39.0 - 52.0 %   MCV 80.9 78.0 - 100.0 fl   MCHC 32.3 30.0 - 36.0 g/dL   RDW 14.4 11.5 - 15.5 %   Platelets 262.0 150.0 - 400.0 K/uL   Neutrophils Relative % 48.9 43.0 - 77.0 %   Lymphocytes Relative 38.6 12.0 - 46.0 %   Monocytes Relative 9.2 3.0 - 12.0 %   Eosinophils Relative 2.7 0.0 - 5.0 %   Basophils Relative 0.6 0.0 - 3.0 %   Neutro Abs 4.4 1.4 - 7.7 K/uL   Lymphs Abs 3.5 0.7 - 4.0 K/uL   Monocytes Absolute 0.8 0.1 - 1.0 K/uL   Eosinophils Absolute 0.2 0.0 - 0.7 K/uL   Basophils Absolute 0.1 0.0 - 0.1 K/uL  Hepatic function panel     Status: Abnormal   Collection Time: 09/07/19  9:33 AM  Result Value Ref Range   Total Bilirubin 1.3 (H) 0.2 - 1.2 mg/dL   Bilirubin, Direct 0.2 0.0 - 0.3 mg/dL   Alkaline Phosphatase 64 39 - 117 U/L   AST 16 0 - 37 U/L   ALT 24 0 - 53 U/L   Total Protein 7.3 6.0 - 8.3 g/dL   Albumin 4.4 3.5 - 5.2 g/dL  TSH     Status: None   Collection Time: 09/07/19  9:33 AM  Result Value Ref Range   TSH 1.96 0.35 - 4.50 uIU/mL  Basic metabolic panel     Status: Abnormal   Collection Time: 09/07/19  9:33 AM  Result Value Ref Range   Sodium 140 135 - 145 mEq/L   Potassium  3.9 3.5 - 5.1 mEq/L   Chloride 101 96 - 112 mEq/L   CO2 28 19 - 32  mEq/L   Glucose, Bld 138 (H) 70 - 99 mg/dL   BUN 15 6 - 23 mg/dL   Creatinine, Ser 1.09 0.40 - 1.50 mg/dL   GFR 84.55 >60.00 mL/min   Calcium 9.9 8.4 - 10.5 mg/dL  Lipid panel     Status: Abnormal   Collection Time: 09/07/19  9:33 AM  Result Value Ref Range   Cholesterol 144 0 - 200 mg/dL    Comment: ATP III Classification       Desirable:  < 200 mg/dL               Borderline High:  200 - 239 mg/dL          High:  > = 240 mg/dL   Triglycerides 183.0 (H) 0.0 - 149.0 mg/dL    Comment: Normal:  <150 mg/dLBorderline High:  150 - 199 mg/dL   HDL 64.70 >39.00 mg/dL   VLDL 36.6 0.0 - 40.0 mg/dL   LDL Cholesterol 43 0 - 99 mg/dL   Total CHOL/HDL Ratio 2     Comment:                Men          Women1/2 Average Risk     3.4          3.3Average Risk          5.0          4.42X Average Risk          9.6          7.13X Average Risk          15.0          11.0                       NonHDL 79.11     Comment: NOTE:  Non-HDL goal should be 30 mg/dL higher than patient's LDL goal (i.e. LDL goal of < 70 mg/dL, would have non-HDL goal of < 100 mg/dL)  Hemoglobin A1c     Status: Abnormal   Collection Time: 09/07/19  9:33 AM  Result Value Ref Range   Hgb A1c MFr Bld 7.3 (H) 4.6 - 6.5 %    Comment: Glycemic Control Guidelines for People with Diabetes:Non Diabetic:  <6%Goal of Therapy: <7%Additional Action Suggested:  >8%   PSA     Status: None   Collection Time: 09/07/19  9:33 AM  Result Value Ref Range   PSA 0.69 0.10 - 4.00 ng/mL    Comment: Test performed using Access [employer]br[employer]ch PSA Assay, a parmagnetic partical, chemiluminecent immunoassay.    Assessment/Plan: 1. Other infective acute otitis externa of right ear Significant pain and swelling.  No evidence of otitis media.  No lymphadenopathy on exam.  Giving severity of symptoms will start both otic antibiotics/steroid and oral steroid, especially giving very slight mastoid tenderness.  Supportive measures and OTC medications reviewed.  Strict ER  precautions reviewed with patient. - ciprofloxacin (CIPRO) 500 MG tablet; Take 1 tablet (500 mg total) by mouth 2 (two) times daily.  Dispense: 14 tablet; Refill: 0 - ciprofloxacin-dexamethasone (CIPRODEX) OTIC suspension; Apply 4 drops to the affected ear(s) twice daily x 7 days  Dispense: 7.5 mL; Refill: 0  Patient voiced understanding and agreement with plan.  This visit occurred during the SARS-CoV-2 public health emergency.  Safety protocols  were in place, including screening questions prior to the visit, additional usage of staff PPE, and extensive cleaning of exam room while observing appropriate contact time as indicated for disinfecting solutions.     Leeanne Rio, PA-C

## 2019-12-02 NOTE — Patient Instructions (Signed)
Please avoid putting anything into the canal.  Wear a cotton ball in the ear when showering to prevent water from getting in.  Apply the new antibiotic drops to the ear as directed. Since this is severe I want you to take an oral antibiotic. I have sent to the pharmacy.  Alternate tylenol and Motrin for pain. Once the medicine gets in your system, things should begin calming down. ER for any acute worsening of symptoms this weekend.  Otitis Externa  Otitis externa is an infection of the outer ear canal. The outer ear canal is the area between the outside of the ear and the eardrum. Otitis externa is sometimes called swimmer's ear. What are the causes? Common causes of this condition include:  Swimming in dirty water.  Moisture in the ear.  An injury to the inside of the ear.  An object stuck in the ear.  A cut or scrape on the outside of the ear. What increases the risk? You are more likely to get this condition if you go swimming often. What are the signs or symptoms?  Itching in the ear. This is often the first symptom.  Swelling of the ear.  Redness in the ear.  Ear pain. The pain may get worse when you pull on your ear.  Pus coming from the ear. How is this treated? This condition may be treated with:  Antibiotic ear drops. These are often given for 10-14 days.  Medicines to reduce itching and swelling. Follow these instructions at home:  If you were given antibiotic ear drops, use them as told by your doctor. Do not stop using them even if your condition gets better.  Take over-the-counter and prescription medicines only as told by your doctor.  Avoid getting water in your ears as told by your doctor. You may be told to avoid swimming or water sports for a few days.  Keep all follow-up visits as told by your doctor. This is important. How is this prevented?  Keep your ears dry. Use the corner of a towel to dry your ears after you swim or bathe.  Try not to  scratch or put things in your ear. Doing these things makes it easier for germs to grow in your ear.  Avoid swimming in lakes, dirty water, or pools that may not have the right amount of a chemical called chlorine. Contact a doctor if:  You have a fever.  Your ear is still red, swollen, or painful after 3 days.  You still have pus coming from your ear after 3 days.  Your redness, swelling, or pain gets worse.  You have a really bad headache.  You have redness, swelling, pain, or tenderness behind your ear. Summary  Otitis externa is an infection of the outer ear canal.  Symptoms include pain, redness, and swelling of the ear.  If you were given antibiotic ear drops, use them as told by your doctor. Do not stop using them even if your condition gets better.  Try not to scratch or put things in your ear. This information is not intended to replace advice given to you by your health care provider. Make sure you discuss any questions you have with your health care provider. Document Revised: 12/04/2017 Document Reviewed: 12/04/2017 Elsevier Patient Education  [employer].

## 2019-12-14 ENCOUNTER — Ambulatory Visit: Payer: 59 | Admitting: Family Medicine

## 2019-12-14 ENCOUNTER — Encounter: Payer: Self-pay | Admitting: Family Medicine

## 2019-12-14 ENCOUNTER — Other Ambulatory Visit: Payer: Self-pay

## 2019-12-14 VITALS — BP 129/89 | HR 79 | Temp 97.9°F | Resp 16 | Ht 68.0 in | Wt 211.1 lb

## 2019-12-14 DIAGNOSIS — H6981 Other specified disorders of Eustachian tube, right ear: Secondary | ICD-10-CM | POA: Diagnosis not present

## 2019-12-14 MED ORDER — CETIRIZINE HCL 10 MG PO TABS: 10.0000 mg | ORAL_TABLET | Freq: Every day | ORAL | 11 refills | Status: DC

## 2019-12-14 MED ORDER — FLUTICASONE PROPIONATE 50 MCG/ACT NA SUSP: 2.0000 | Freq: Every day | NASAL | 6 refills | Status: DC

## 2019-12-14 NOTE — Progress Notes (Signed)
   Subjective:    Patient ID: Lee Greene, male    DOB: 08-Oct-1960, 59 y.o.   MRN: [id number]  HPI Ear fullness- pt was seen on 5/21 and dx'd w/ R otitis externa and tx'd w/ Ciprodex and oral Cipro (due to slight TTP over mastoid).  Pt reports hearing is muffled.  Denies ear pain.  No fevers.  No drainage from ear.  Denies nasal congestion.  No reported issues w/ seasonal allergies.  L ear is 'fine'.  Pt has sensation of ear needing to pop.   Review of Systems For ROS see HPI   This visit occurred during the SARS-CoV-2 public health emergency.  Safety protocols were in place, including screening questions prior to the visit, additional usage of staff PPE, and extensive cleaning of exam room while observing appropriate contact time as indicated for disinfecting solutions.       Objective:   Physical Exam Vitals reviewed.  Constitutional:      General: He is not in acute distress.    Appearance: Normal appearance. He is not ill-appearing.  HENT:     Head: Normocephalic and atraumatic.     Left Ear: Tympanic membrane and ear canal normal.     Ears:     Comments: R TM retracted- no visible air/fluid level Musculoskeletal:     Cervical back: Normal range of motion.  Lymphadenopathy:     Cervical: No cervical adenopathy.  Skin:    General: Skin is warm and dry.  Neurological:     General: No focal deficit present.     Mental Status: He is alert and oriented to person, place, and time.  Psychiatric:        Mood and Affect: Mood normal.        Behavior: Behavior normal.        Thought Content: Thought content normal.           Assessment & Plan:  Eustachian tube dysfxn- new.  Reviewed dx and supportive care w/ pt.  Start OTC antihistamine and nasal steroid.  If no improvement, will need ENT appt.  Pt expressed understanding and is in agreement w/ plan.

## 2019-12-14 NOTE — Patient Instructions (Signed)
Follow up as needed or as scheduled START once daily Cetirizine (Zyrtec) USE the Fluticasone (Flonase) nasal spray- 2 sprays each nostril- daily I sent prescriptions for both- but check if OTC is cheaper Drink LOTS of water Call with any questions or concerns- particularly if not improving Hang in there!

## 2020-01-04 ENCOUNTER — Ambulatory Visit: Payer: 59 | Admitting: Family Medicine

## 2020-01-04 DIAGNOSIS — Z0289 Encounter for other administrative examinations: Secondary | ICD-10-CM

## 2020-01-10 ENCOUNTER — Other Ambulatory Visit: Payer: Self-pay | Admitting: Internal Medicine

## 2020-01-31 ENCOUNTER — Other Ambulatory Visit: Payer: Self-pay | Admitting: Internal Medicine

## 2020-02-21 ENCOUNTER — Other Ambulatory Visit: Payer: Self-pay | Admitting: General Practice

## 2020-02-21 MED ORDER — LOSARTAN POTASSIUM-HCTZ 100-25 MG PO TABS: 1.0000 | ORAL_TABLET | Freq: Every day | ORAL | 1 refills | Status: DC

## 2020-07-23 ENCOUNTER — Other Ambulatory Visit: Payer: Self-pay

## 2020-07-23 ENCOUNTER — Telehealth: Payer: Self-pay | Admitting: Family Medicine

## 2020-07-23 ENCOUNTER — Telehealth: Payer: Self-pay

## 2020-07-23 DIAGNOSIS — E119 Type 2 diabetes mellitus without complications: Secondary | ICD-10-CM

## 2020-07-23 MED ORDER — FREESTYLE LIBRE 14 DAY SENSOR MISC: 1.0000 | 3 refills | Status: DC

## 2020-07-23 NOTE — Telephone Encounter (Signed)
Rx sent to patient preferred pharmacy. Pt notified.

## 2020-07-23 NOTE — Telephone Encounter (Signed)
Pt called In asking for a new script of the Freestyle libra sensor to be sent to be sent to [facility].  Please advise

## 2020-07-23 NOTE — Telephone Encounter (Signed)
Called and spoke with patient regarding med refill. Rx sent to patient preferred pharmacy. No further concerns at this time.

## 2020-07-24 ENCOUNTER — Other Ambulatory Visit: Payer: Self-pay | Admitting: Internal Medicine

## 2020-07-26 ENCOUNTER — Telehealth: Payer: Self-pay

## 2020-07-26 ENCOUNTER — Telehealth: Payer: Self-pay | Admitting: Family Medicine

## 2020-07-26 ENCOUNTER — Other Ambulatory Visit: Payer: Self-pay

## 2020-07-26 DIAGNOSIS — E119 Type 2 diabetes mellitus without complications: Secondary | ICD-10-CM

## 2020-07-26 MED ORDER — METFORMIN HCL ER 500 MG PO TB24: ORAL_TABLET | ORAL | 0 refills | Status: DC

## 2020-07-26 NOTE — Telephone Encounter (Signed)
Rx sent to pharmacy. Patient notified. 

## 2020-07-26 NOTE — Telephone Encounter (Signed)
Pt called in asking for a refill on the Metformin, pt uses [facility] on [address]

## 2020-07-26 NOTE — Telephone Encounter (Signed)
Called and spoke to patient about Rx. Pt notified that Rx has been sent to pharmacy.

## 2020-08-22 ENCOUNTER — Other Ambulatory Visit: Payer: Self-pay

## 2020-08-22 DIAGNOSIS — I10 Essential (primary) hypertension: Secondary | ICD-10-CM

## 2020-08-22 MED ORDER — LOSARTAN POTASSIUM-HCTZ 100-25 MG PO TABS: 1.0000 | ORAL_TABLET | Freq: Every day | ORAL | 0 refills | Status: DC

## 2020-08-24 ENCOUNTER — Emergency Department (HOSPITAL_COMMUNITY): Payer: BLUE CROSS/BLUE SHIELD

## 2020-08-24 ENCOUNTER — Emergency Department (HOSPITAL_COMMUNITY)
Admission: EM | Admit: 2020-08-24 | Discharge: 2020-08-24 | Disposition: A | Discharge status: Home / Self Care | Payer: BLUE CROSS/BLUE SHIELD | Attending: Emergency Medicine | Admitting: Emergency Medicine

## 2020-08-24 ENCOUNTER — Other Ambulatory Visit: Payer: Self-pay

## 2020-08-24 ENCOUNTER — Encounter (HOSPITAL_COMMUNITY): Payer: Self-pay

## 2020-08-24 DIAGNOSIS — K760 Fatty (change of) liver, not elsewhere classified: Secondary | ICD-10-CM | POA: Diagnosis not present

## 2020-08-24 DIAGNOSIS — Z8616 Personal history of COVID-19: Secondary | ICD-10-CM | POA: Insufficient documentation

## 2020-08-24 DIAGNOSIS — R112 Nausea with vomiting, unspecified: Secondary | ICD-10-CM | POA: Insufficient documentation

## 2020-08-24 DIAGNOSIS — E119 Type 2 diabetes mellitus without complications: Secondary | ICD-10-CM | POA: Diagnosis not present

## 2020-08-24 DIAGNOSIS — N23 Unspecified renal colic: Secondary | ICD-10-CM

## 2020-08-24 DIAGNOSIS — Z7984 Long term (current) use of oral hypoglycemic drugs: Secondary | ICD-10-CM | POA: Insufficient documentation

## 2020-08-24 DIAGNOSIS — R109 Unspecified abdominal pain: Secondary | ICD-10-CM | POA: Insufficient documentation

## 2020-08-24 DIAGNOSIS — N281 Cyst of kidney, acquired: Secondary | ICD-10-CM | POA: Diagnosis not present

## 2020-08-24 DIAGNOSIS — N200 Calculus of kidney: Secondary | ICD-10-CM | POA: Diagnosis not present

## 2020-08-24 DIAGNOSIS — I10 Essential (primary) hypertension: Secondary | ICD-10-CM | POA: Insufficient documentation

## 2020-08-24 LAB — URINALYSIS, ROUTINE W REFLEX MICROSCOPIC
Bacteria, UA: NONE SEEN
Bilirubin Urine: NEGATIVE
Glucose, UA: >=500 mg/dL — AB
Ketones, ur: 5 mg/dL — AB
Leukocytes,Ua: NEGATIVE
Nitrite: NEGATIVE
Protein, ur: NEGATIVE mg/dL
Specific Gravity, Urine: 1.024 (ref 1.005–1.030)
pH: 5 (ref 5.0–8.0)

## 2020-08-24 LAB — COMPREHENSIVE METABOLIC PANEL
ALT: 60 U/L — ABNORMAL HIGH (ref 0–44)
AST: 35 U/L (ref 15–41)
Albumin: 4.4 g/dL (ref 3.5–5.0)
Alkaline Phosphatase: 80 U/L (ref 38–126)
Anion gap: 10 (ref 5–15)
BUN: 15 mg/dL (ref 6–20)
CO2: 27 mmol/L (ref 22–32)
Calcium: 9.2 mg/dL (ref 8.9–10.3)
Chloride: 100 mmol/L (ref 98–111)
Creatinine, Ser: 1.29 mg/dL — ABNORMAL HIGH (ref 0.61–1.24)
GFR, Estimated: >60 mL/min (ref >60)
Glucose, Bld: 350 mg/dL — ABNORMAL HIGH (ref 70–99)
Potassium: 3.7 mmol/L (ref 3.5–5.1)
Sodium: 137 mmol/L (ref 135–145)
Total Bilirubin: 1.3 mg/dL — ABNORMAL HIGH (ref 0.3–1.2)
Total Protein: 8 g/dL (ref 6.5–8.1)

## 2020-08-24 LAB — CBC
HCT: 47.3 % (ref 39.0–52.0)
Hemoglobin: 14.9 g/dL (ref 13.0–17.0)
MCH: 25.8 pg — ABNORMAL LOW (ref 26.0–34.0)
MCHC: 31.5 g/dL (ref 30.0–36.0)
MCV: 81.8 fL (ref 80.0–100.0)
Platelets: 283 10*3/uL (ref 150–400)
RBC: 5.78 MIL/uL (ref 4.22–5.81)
RDW: 13.6 % (ref 11.5–15.5)
WBC: 9.9 10*3/uL (ref 4.0–10.5)
nRBC: 0 % (ref 0.0–0.2)

## 2020-08-24 LAB — LIPASE, BLOOD: Lipase: 32 U/L (ref 11–51)

## 2020-08-24 LAB — D-DIMER, QUANTITATIVE: D-Dimer, Quant: 0.3 ug/mL-FEU (ref 0.00–0.50)

## 2020-08-24 MED ORDER — HYDROCODONE-ACETAMINOPHEN 5-325 MG PO TABS: 2.0000 | ORAL_TABLET | ORAL | 0 refills | Status: DC | PRN

## 2020-08-24 MED ORDER — IBUPROFEN 600 MG PO TABS
600.0000 mg | ORAL_TABLET | Freq: Four times a day (QID) | ORAL | 0 refills | Status: DC | PRN
Start: 2020-08-24 — End: 2022-08-14

## 2020-08-24 MED ORDER — IOHEXOL 300 MG/ML  SOLN
100.0000 mL | Freq: Once | INTRAMUSCULAR | Status: AC | PRN
  Administered 2020-08-24: 100 mL via INTRAVENOUS

## 2020-08-24 MED ORDER — KETOROLAC TROMETHAMINE 30 MG/ML IJ SOLN
15.0000 mg | Freq: Once | INTRAMUSCULAR | Status: AC
  Administered 2020-08-24: 15 mg via INTRAVENOUS
  Filled 2020-08-24: qty 1

## 2020-08-24 MED ORDER — SODIUM CHLORIDE 0.9 % IV BOLUS
500.0000 mL | Freq: Once | INTRAVENOUS | Status: AC
  Administered 2020-08-24: 500 mL via INTRAVENOUS

## 2020-08-24 MED ORDER — ONDANSETRON 8 MG PO TBDP: 8.0000 mg | ORAL_TABLET | Freq: Three times a day (TID) | ORAL | 0 refills | Status: DC | PRN

## 2020-08-24 MED ORDER — MORPHINE SULFATE (PF) 4 MG/ML IV SOLN
8.0000 mg | INTRAVENOUS | Status: DC | PRN
  Administered 2020-08-24: 8 mg via INTRAVENOUS
  Filled 2020-08-24: qty 2

## 2020-08-24 MED ORDER — MORPHINE SULFATE (PF) 4 MG/ML IV SOLN
8.0000 mg | Freq: Once | INTRAVENOUS | Status: AC
  Administered 2020-08-24: 8 mg via INTRAVENOUS
  Filled 2020-08-24: qty 2

## 2020-08-24 NOTE — ED Notes (Signed)
Family at bedside. 

## 2020-08-24 NOTE — Discharge Instructions (Signed)
We saw you in the ER for the abdominal pain. °Our results indicate that you have a kidney stone. °We were able to get your pain is relative control, and we can safely send you home. ° °Take the meds prescribed. °Set up an appointment with the Urologist. °If the pain is unbearable, you start having fevers, chills, and are unable to keep any meds down - then return to the ER. °  °

## 2020-08-24 NOTE — ED Triage Notes (Signed)
Pt arrived via walk in, c/o left lower back pain radiating to left flank and abd.States nausea and vomited x this morning. Denies any diarrhea or urinary sx. Denies any recent  trauma to area.

## 2020-08-24 NOTE — ED Provider Notes (Addendum)
[facility] DEPT Provider Note   CSN: [id number] Arrival date & time: 08/24/20  2094     History Chief Complaint  Patient presents with  . Flank Pain    Lee Greene is a 60 y.o. male.  HPI     60 year old male comes in with chief complaint of flank pain.  Patient has history of diabetes, hypertension and had COVID-19 more than a year ago.  Patient reports that he started having left-sided flank pain starting this morning.  He had associated nausea with vomiting at the onset.  The pain has been fairly constant, with waxing and waning intensity.  There is no specific evoking, aggravating or relieving factors.  Patient denies any burning with urination, blood in the urine.  No trauma.  No history of kidney stones.  No history of surgeries.  Patient denies any cough, the pain is not pleuritic.  Past Medical History:  Diagnosis Date  . Chest pain    no issues since 2014   . Diabetes mellitus without complication ([place])    diet controlled, no medicines   . Hypertension    off BP medicines x 3 months as of 07-03-15    Patient Active Problem List   Diagnosis Date Noted  . History of COVID-19 08/24/2019  . Overweight (BMI 25.0-29.9) 03/20/2017  . Exertional chest pain 04/29/2013  . Diabetes mellitus without complication ([place]) 70/96/2836  . Erectile dysfunction 02/03/2013  . Physical exam, annual 01/29/2012  . HTN (hypertension) 04/12/2010  . MIGRAINE 10/05/2008  . RHINITIS 10/05/2008    Past Surgical History:  Procedure Laterality Date  . HAND SURGERY     1990's       Family History  Problem Relation Age of Onset  . Hypertension Father   . Diabetes Father   . Colon cancer Neg Hx   . Colon polyps Neg Hx   . Esophageal cancer Neg Hx   . Rectal cancer Neg Hx   . Stomach cancer Neg Hx     Social History   Tobacco Use  . Smoking status: Never Smoker  . Smokeless tobacco: Never Used  Vaping Use  . Vaping Use: Never used   Substance Use Topics  . Alcohol use: No    Alcohol/week: 0.0 standard drinks  . Drug use: No    Home Medications Prior to Admission medications   Medication Sig Start Date End Date Taking? Authorizing Provider  cetirizine (ZYRTEC) 10 MG tablet Take 1 tablet (10 mg total) by mouth daily. 12/14/19   Midge Minium, MD  Continuous Blood Gluc Receiver (FREESTYLE LIBRE 14 DAY READER) DEVI 1 each by Does not apply route 3 (three) times daily. Use to test CGM as directed.  DX E11.9 05/02/19   Midge Minium, MD  Continuous Blood Gluc Sensor (FREESTYLE LIBRE 14 DAY SENSOR) MISC 1 each by Does not apply route every 14 (fourteen) days. 07/23/20   Midge Minium, MD  fluticasone (FLONASE) 50 MCG/ACT nasal spray Place 2 sprays into both nostrils daily. 12/14/19   Midge Minium, MD  losartan-hydrochlorothiazide (HYZAAR) 100-25 MG tablet Take 1 tablet by mouth daily. 08/22/20   Midge Minium, MD  metFORMIN (GLUCOPHAGE-XR) 500 MG 24 hr tablet TAKE 2 TABLETS(1000 MG) BY MOUTH TWICE DAILY WITH A MEAL 07/26/20   Midge Minium, MD    Allergies    Patient has no known allergies.  Review of Systems   Review of Systems  Constitutional: Positive for activity change.  Respiratory:  Negative for shortness of breath.   Cardiovascular: Negative for chest pain.  Gastrointestinal: Positive for abdominal pain, nausea and vomiting.  Genitourinary: Negative for dysuria.  All other systems reviewed and are negative.   Physical Exam Updated Vital Signs BP 130/90   Pulse 85   Temp 98.3 F (36.8 C)   Resp 15   Ht 5\' 9"  (1.753 m)   Wt 90.7 kg   SpO2 96%   BMI 29.53 kg/m   Physical Exam Vitals and nursing note reviewed.  Constitutional:      Appearance: He is well-developed.  HENT:     Head: Atraumatic.  Cardiovascular:     Rate and Rhythm: Normal rate.  Pulmonary:     Effort: Pulmonary effort is normal.  Abdominal:     Tenderness: There is abdominal tenderness. There is  left CVA tenderness and guarding.  Musculoskeletal:     Cervical back: Neck supple.  Skin:    General: Skin is warm.  Neurological:     Mental Status: He is alert and oriented to person, place, and time.     ED Results / Procedures / Treatments   Labs (all labs ordered are listed, but only abnormal results are displayed) Labs Reviewed  COMPREHENSIVE METABOLIC PANEL - Abnormal; Notable for the following components:      Result Value   Glucose, Bld 350 (*)    Creatinine, Ser 1.29 (*)    ALT 60 (*)    Total Bilirubin 1.3 (*)    All other components within normal limits  CBC - Abnormal; Notable for the following components:   MCH 25.8 (*)    All other components within normal limits  URINALYSIS, ROUTINE W REFLEX MICROSCOPIC - Abnormal; Notable for the following components:   Glucose, UA >=500 (*)    Hgb urine dipstick MODERATE (*)    Ketones, ur 5 (*)    All other components within normal limits  LIPASE, BLOOD  D-DIMER, QUANTITATIVE (NOT AT [facility])    EKG EKG Interpretation  Date/Time:  Friday August 24 2020 13:35:01 EST Ventricular Rate:  94 PR Interval:    QRS Duration: 87 QT Interval:  360 QTC Calculation: 451 R Axis:   66 Text Interpretation: Sinus rhythm Left atrial enlargement Low voltage, precordial leads No acute changes No significant change since last tracing Confirmed by Varney Biles ([id number]) on 08/24/2020 2:32:52 PM   Radiology US Renal  Result Date: 08/24/2020 CLINICAL DATA:  Left flank pain. EXAM: RENAL / URINARY TRACT ULTRASOUND COMPLETE COMPARISON:  CT, 08/10/2009. FINDINGS: Right Kidney: Renal measurements: 10.6 x 5.1 x 5.2 cm = volume: 147.5 mL. Echogenicity within normal limits. No mass or hydronephrosis visualized. Left Kidney: Renal measurements: 13.7 x 7.0 x 5.9 cm = volume: 293.7 mL. Echogenicity within normal limits. No mass or hydronephrosis visualized. Bladder: Appears normal for degree of bladder distention. Other: None. IMPRESSION: Normal  renal ultrasound. Electronically Signed   By: Lajean Manes M.D.   On: 08/24/2020 14:36    Procedures Procedures   Medications Ordered in ED Medications  morphine 4 MG/ML injection 8 mg (8 mg Intravenous Given 08/24/20 1602)  morphine 4 MG/ML injection 8 mg (8 mg Intravenous Given 08/24/20 1343)  sodium chloride 0.9 % bolus 500 mL (0 mLs Intravenous Stopped 08/24/20 1602)  iohexol (OMNIPAQUE) 300 MG/ML solution 100 mL (100 mLs Intravenous Contrast Given 08/24/20 1625)    ED Course  I have reviewed the triage vital signs and the nursing notes.  Pertinent labs & imaging results that  were available during my care of the patient were reviewed by me and considered in my medical decision making (see chart for details).  Clinical Course as of 08/24/20 1742  Fri Aug 24, 2020  1741 US Renal Both the ultrasound renal and D-dimer are reassuring.  Given that patient did have severe pain in the left upper quadrant and flank region, CT scan with contrast ordered.  The CT scan reveals 4 mm kidney stone.  Results of the ER work-up discussed with the patient.  He stable for discharge. [AN]    Clinical Course User Index [AN] Varney Biles, MD   MDM Rules/Calculators/A&P                          60 year old comes in a chief complaint of left-sided abdominal pain and flank pain.  He has history of diabetes, hypertension.  No history of CAD.  Pain is not pleuritic.  There is no history of kidney stones or abdominal surgeries.  No urinary symptoms.  Differential diagnosis includes kidney stone, splenic infarct, renal infarct, pyelonephritis.  I also considered PE in the differential given the location and prior history of Covid, D-dimer ordered for low Wells score.  Ultrasound renal ordered to see if there is any evidence of stone.  Previous CT scan several years back did not reveal stone.  Appropriate labs ordered, patient will need reassessment.  Final Clinical Impression(s) / ED Diagnoses Final  diagnoses:  None    Rx / DC Orders ED Discharge Orders    None       Varney Biles, MD 08/24/20 1631

## 2020-09-19 DIAGNOSIS — E1159 Type 2 diabetes mellitus with other circulatory complications: Secondary | ICD-10-CM | POA: Diagnosis not present

## 2020-09-19 DIAGNOSIS — I10 Essential (primary) hypertension: Secondary | ICD-10-CM | POA: Diagnosis not present

## 2020-10-22 ENCOUNTER — Other Ambulatory Visit: Payer: Self-pay | Admitting: Family Medicine

## 2020-10-22 DIAGNOSIS — E119 Type 2 diabetes mellitus without complications: Secondary | ICD-10-CM

## 2020-10-29 ENCOUNTER — Encounter: Payer: Self-pay | Admitting: Gastroenterology

## 2020-11-01 ENCOUNTER — Ambulatory Visit
Admission: EM | Admit: 2020-11-01 | Discharge: 2020-11-01 | Disposition: A | Discharge status: Home / Self Care | Payer: BLUE CROSS/BLUE SHIELD | Attending: Emergency Medicine | Admitting: Emergency Medicine

## 2020-11-01 ENCOUNTER — Other Ambulatory Visit: Payer: Self-pay

## 2020-11-01 DIAGNOSIS — Z23 Encounter for immunization: Secondary | ICD-10-CM

## 2020-11-01 DIAGNOSIS — S91331A Puncture wound without foreign body, right foot, initial encounter: Secondary | ICD-10-CM

## 2020-11-01 MED ORDER — TETANUS-DIPHTH-ACELL PERTUSSIS 5-2.5-18.5 LF-MCG/0.5 IM SUSY
0.5000 mL | PREFILLED_SYRINGE | Freq: Once | INTRAMUSCULAR | Status: AC
  Administered 2020-11-01: 0.5 mL via INTRAMUSCULAR

## 2020-11-01 MED ORDER — CEPHALEXIN 500 MG PO CAPS: 500.0000 mg | ORAL_CAPSULE | Freq: Four times a day (QID) | ORAL | 0 refills | Status: AC

## 2020-11-01 NOTE — ED Triage Notes (Signed)
Pt stepped on a nail in the yard yesterday morning sustaining small puncture injury. Has not had tetanus shot in over 5 years

## 2020-11-01 NOTE — Discharge Instructions (Addendum)
Tetanus updated today Begin Keflex 4 times daily for 5 days for prevention of infection Tylenol and ibuprofen as needed for pain/soreness Follow-up for any concerns

## 2020-11-01 NOTE — ED Provider Notes (Signed)
[facility]    CSN: [id number] Arrival date & time: 11/01/20  1033      History   Chief Complaint Chief Complaint  Patient presents with  . Stepped on Nail    HPI Lee Greene is a 60 y.o. male history of hypertension, DM type II presenting today for evaluation of puncture wound.  Reports stepped on a nail in the yard yesterday.  Went through shoe and slightly punctured foot.  He reports some mild soreness associated with this.  Denies any concern regarding underlying fracture.  Last tetanus shot greater than 5 years ago.  Does have history of diabetes, sugars typically 1 50-200.  HPI  Past Medical History:  Diagnosis Date  . Chest pain    no issues since 2014   . Diabetes mellitus without complication ([place])    diet controlled, no medicines   . Hypertension    off BP medicines x 3 months as of 07-03-15    Patient Active Problem List   Diagnosis Date Noted  . History of COVID-19 08/24/2019  . Overweight (BMI 25.0-29.9) 03/20/2017  . Exertional chest pain 04/29/2013  . Diabetes mellitus without complication ([place]) 70/78/6754  . Erectile dysfunction 02/03/2013  . Physical exam, annual 01/29/2012  . HTN (hypertension) 04/12/2010  . MIGRAINE 10/05/2008  . RHINITIS 10/05/2008    Past Surgical History:  Procedure Laterality Date  . HAND SURGERY     1990's       Home Medications    Prior to Admission medications   Medication Sig Start Date End Date Taking? Authorizing Provider  cephALEXin (KEFLEX) 500 MG capsule Take 1 capsule (500 mg total) by mouth 4 (four) times daily for 5 days. 11/01/20 11/06/20 Yes Priti Consoli C, PA-C  ibuprofen (ADVIL) 600 MG tablet Take 1 tablet (600 mg total) by mouth every 6 (six) hours as needed. 08/24/20  Yes Nanavati, Thelma Comp, MD  losartan-hydrochlorothiazide (HYZAAR) 100-25 MG tablet Take 1 tablet by mouth daily. 08/22/20  Yes Midge Minium, MD  metFORMIN (GLUCOPHAGE-XR) 500 MG 24 hr tablet TAKE 2 TABLETS(1000  MG) BY MOUTH TWICE DAILY WITH A MEAL 10/22/20  Yes Midge Minium, MD  Continuous Blood Gluc Receiver (FREESTYLE LIBRE 14 DAY READER) DEVI 1 each by Does not apply route 3 (three) times daily. Use to test CGM as directed.  DX E11.9 05/02/19   Midge Minium, MD  Continuous Blood Gluc Sensor (FREESTYLE LIBRE 14 DAY SENSOR) MISC 1 each by Does not apply route every 14 (fourteen) days. 07/23/20   Midge Minium, MD  cetirizine (ZYRTEC) 10 MG tablet Take 1 tablet (10 mg total) by mouth daily. 12/14/19 11/01/20  Midge Minium, MD  fluticasone (FLONASE) 50 MCG/ACT nasal spray Place 2 sprays into both nostrils daily. 12/14/19 11/01/20  Midge Minium, MD    Family History Family History  Problem Relation Age of Onset  . Hypertension Father   . Diabetes Father   . Colon cancer Neg Hx   . Colon polyps Neg Hx   . Esophageal cancer Neg Hx   . Rectal cancer Neg Hx   . Stomach cancer Neg Hx     Social History Social History   Tobacco Use  . Smoking status: Never Smoker  . Smokeless tobacco: Never Used  Vaping Use  . Vaping Use: Never used  Substance Use Topics  . Alcohol use: No    Alcohol/week: 0.0 standard drinks  . Drug use: No     Allergies  Patient has no known allergies.   Review of Systems Review of Systems  Constitutional: Negative for fatigue and fever.  Eyes: Negative for redness, itching and visual disturbance.  Respiratory: Negative for shortness of breath.   Cardiovascular: Negative for chest pain and leg swelling.  Gastrointestinal: Negative for nausea and vomiting.  Musculoskeletal: Negative for arthralgias and myalgias.  Skin: Positive for wound. Negative for color change and rash.  Neurological: Negative for dizziness, syncope, weakness, light-headedness and headaches.     Physical Exam Triage Vital Signs ED Triage Vitals  Enc Vitals Group     BP 11/01/20 1043 135/80     Pulse Rate 11/01/20 1043 89     Resp 11/01/20 1043 18     Temp  11/01/20 1043 99.2 F (37.3 C)     Temp src --      SpO2 11/01/20 1043 97 %     Weight --      Height --      Head Circumference --      Peak Flow --      Pain Score 11/01/20 1041 2     Pain Loc --      Pain Edu? --      Excl. in [place]? --    No data found.  Updated Vital Signs BP 135/80   Pulse 89   Temp 99.2 F (37.3 C)   Resp 18   SpO2 97%   Visual Acuity Right Eye Distance:   Left Eye Distance:   Bilateral Distance:    Right Eye Near:   Left Eye Near:    Bilateral Near:     Physical Exam Vitals and nursing note reviewed.  Constitutional:      Appearance: He is well-developed.     Comments: No acute distress  HENT:     Head: Normocephalic and atraumatic.     Nose: Nose normal.  Eyes:     Conjunctiva/sclera: Conjunctivae normal.  Cardiovascular:     Rate and Rhythm: Normal rate.  Pulmonary:     Effort: Pulmonary effort is normal. No respiratory distress.  Abdominal:     General: There is no distension.  Musculoskeletal:        General: Normal range of motion.     Cervical back: Neck supple.     Comments: Plantar surface of right foot with small puncture wound noted to area of her distal metatarsals midline, no surrounding erythema or warmth, no sign of any drainage, mild tenderness to palpation  Skin:    General: Skin is warm and dry.  Neurological:     Mental Status: He is alert and oriented to person, place, and time.      UC Treatments / Results  Labs (all labs ordered are listed, but only abnormal results are displayed) Labs Reviewed - No data to display  EKG   Radiology No results found.  Procedures Procedures (including critical care time)  Medications Ordered in UC Medications  Tdap (BOOSTRIX) injection 0.5 mL (0.5 mLs Intramuscular Given 11/01/20 1100)    Initial Impression / Assessment and Plan / UC Course  I have reviewed the triage vital signs and the nursing notes.  Pertinent labs & imaging results that were available during  my care of the patient were reviewed by me and considered in my medical decision making (see chart for details).     Puncture wound from nail-updating tetanus today, placing on Keflex empirically for infection given diabetes status, discussed wound care, low suspicion of underlying fracture,  anti-inflammatories and monitor for healing.  Discussed strict return precautions. Patient verbalized understanding and is agreeable with plan.  Final Clinical Impressions(s) / UC Diagnoses   Final diagnoses:  Puncture wound of plantar aspect of right foot, initial encounter     Discharge Instructions     Tetanus updated today Begin Keflex 4 times daily for 5 days for prevention of infection Tylenol and ibuprofen as needed for pain/soreness Follow-up for any concerns    ED Prescriptions    Medication Sig Dispense Auth. Provider   cephALEXin (KEFLEX) 500 MG capsule Take 1 capsule (500 mg total) by mouth 4 (four) times daily for 5 days. 20 capsule Catharine Kettlewell, [place] C, PA-C     PDMP not reviewed this encounter.   Janith Lima, PA-C 11/01/20 1102

## 2021-01-09 ENCOUNTER — Encounter: Payer: Self-pay | Admitting: *Deleted

## 2021-02-15 ENCOUNTER — Other Ambulatory Visit: Payer: Self-pay | Admitting: Family Medicine

## 2021-02-15 DIAGNOSIS — E119 Type 2 diabetes mellitus without complications: Secondary | ICD-10-CM

## 2021-02-21 ENCOUNTER — Other Ambulatory Visit: Payer: Self-pay

## 2021-02-21 DIAGNOSIS — I10 Essential (primary) hypertension: Secondary | ICD-10-CM

## 2021-02-21 MED ORDER — LOSARTAN POTASSIUM-HCTZ 100-25 MG PO TABS: 1.0000 | ORAL_TABLET | Freq: Every day | ORAL | 0 refills | Status: DC

## 2021-07-02 DIAGNOSIS — E1151 Type 2 diabetes mellitus with diabetic peripheral angiopathy without gangrene: Secondary | ICD-10-CM | POA: Insufficient documentation

## 2021-07-02 DIAGNOSIS — Z86010 Personal history of colonic polyps: Secondary | ICD-10-CM | POA: Insufficient documentation

## 2021-07-28 NOTE — Progress Notes (Addendum)
Subjective:    Lee Greene - 61 y.o. male MRN [id number]  Date of birth: 1960-08-15  HPI  Lenoard A Hertenstein is to establish care.   Current issues and/or concerns: DIABETES TYPE 2: Med Adherence:  []  Yes    [x]  No, reports taking Metformin once daily related to side effects of diarrhea Medication side effects:  [x]  Yes, diarrhea Home Monitoring?  [x]  Yes    []  No Home glucose results range: 200's  Diet Adherence: []  Yes    [x]  No Exercise: [x]  Yes    []  No  2. HYPERTENSION: Med Adherence: [x]  Yes    []  No Medication side effects: []  Yes    [x]  No Adherence with salt restriction (low-salt diet): []  Yes   [x]  No Exercise: Yes [x]  No []  Home Monitoring?: [x]  Yes    []  No Monitoring Frequency: []  Yes    [x]  No Home BP results range: [x]  Yes, 120's-130's/80's Smoking []  Yes [x]  No SOB? []  Yes    [x]  No Chest Pain?: []  Yes    [x]  No   ROS per HPI    Health Maintenance:  Health Maintenance Due  Topic Date Due   HIV Screening  Never done   Hepatitis C Screening  Never done   FOOT EXAM  09/28/2019   OPHTHALMOLOGY EXAM  11/03/2019   COVID-19 Vaccine (3 - Booster for Moderna series) 12/06/2019   COLONOSCOPY (Pts 45-51yrs Insurance coverage will need to be confirmed)  07/16/2020    Past Medical History: Patient Active Problem List   Diagnosis Date Noted   Nephrolithiasis 07/31/2021   Obesity 07/31/2021   Type 2 diabetes mellitus with other circulatory complications ([place]) 05/10/2535   Personal history of colonic polyps 07/31/2021   Personal history of COVID-19 07/31/2021   History of colonic polyps 07/02/2021   Peripheral vascular disorder due to diabetes mellitus ([place]) 07/02/2021   History of COVID-19 08/24/2019   Overweight (BMI 25.0-29.9) 03/20/2017   Exertional chest pain 04/29/2013   Hypertension 04/29/2013   Diabetes mellitus without complication ([place]) 64/40/3474   Erectile dysfunction 02/03/2013   Physical exam, annual 01/29/2012   HTN (hypertension)  04/12/2010   Migraine 10/05/2008   RHINITIS 10/05/2008    Social History   reports that he has never smoked. He has never used smokeless tobacco. He reports that he does not drink alcohol and does not use drugs.   Family History  family history includes Diabetes in his father; Hypertension in his father.   Medications: reviewed and updated   Objective:   Physical Exam BP 129/85 (BP Location: Left Arm, Patient Position: Sitting, Cuff Size: Large)    Pulse 86    Temp 98.3 F (36.8 C)    Resp 18    Ht 5' 7.52" (1.715 m)    Wt 199 lb (90.3 kg)    SpO2 95%    BMI 30.69 kg/m   Physical Exam HENT:     Head: Normocephalic and atraumatic.  Eyes:     Extraocular Movements: Extraocular movements intact.     Conjunctiva/sclera: Conjunctivae normal.     Pupils: Pupils are equal, round, and reactive to light.  Cardiovascular:     Rate and Rhythm: Normal rate and regular rhythm.     Pulses: Normal pulses.     Heart sounds: Normal heart sounds.  Pulmonary:     Effort: Pulmonary effort is normal.     Breath sounds: Normal breath sounds.  Musculoskeletal:     Cervical back: Normal range of  motion and neck supple.  Neurological:     General: No focal deficit present.     Mental Status: He is alert and oriented to person, place, and time.  Psychiatric:        Mood and Affect: Mood normal.        Behavior: Behavior normal.   Results for orders placed or performed in visit on 07/31/21  POCT glycosylated hemoglobin (Hb A1C)  Result Value Ref Range   Hemoglobin A1C 13.5 (A) 4.0 - 5.6 %   HbA1c POC (<> result, manual entry)     HbA1c, POC (prediabetic range)     HbA1c, POC (controlled diabetic range)       Assessment & Plan:  1. Encounter to establish care: - Patient presents today to establish care.  - Return for annual physical examination, labs, and health maintenance. Arrive fasting meaning having no food for at least 8 hours prior to appointment. You may have only water or black  coffee. Please take scheduled medications as normal.  2. Uncontrolled type 2 diabetes mellitus with hyperglycemia ([place]): - Hemoglobin A1c today not at goal at 13.5%, goal < 7%. This is increased from previous 7.3% on 09/08/2019. - Patient intolerant to Metformin (Glumetza)1000 mg twice daily as this causes diarrhea. Patient able to tolerate Metformin (Glumetza) 1000 mg once daily.  - Will change Metformin (Glumetza) to Metformin XR with goal of decreasing side effects of diarrhea.  - Begin Insulin Glargine as prescribed.  - Discussed the importance of healthy eating habits, low-carbohydrate diet, low-sugar diet, regular aerobic exercise (at least 150 minutes a week as tolerated) and medication compliance to achieve or maintain control of diabetes. - To achieve an A1C goal of less than or equal to 7.0 percent, a fasting blood sugar of 80 to 130 mg/dL and a postprandial glucose (90 to 120 minutes after a meal) less than 180 mg/dL. In the event of sugars less than 60 mg/dl or greater than 400 mg/dl please notify the clinic ASAP. It is recommended that you undergo annual eye exams and annual foot exams. - BMP to evaluate kidney function and electrolyte balance. - Follow-up with primary provider in 4 weeks or sooner if needed, will recheck hemoglobin A1c at that time. - POCT glycosylated hemoglobin (Hb I3K) - Basic Metabolic Panel - metFORMIN (GLUCOPHAGE XR) 500 MG 24 hr tablet; Take 2 tablets (1,000 mg total) by mouth daily with breakfast.  Dispense: 60 tablet; Refill: 0 - insulin glargine (LANTUS) 100 UNIT/ML injection; Inject 0.1 mLs (10 Units total) into the skin at bedtime.  Dispense: 3 mL; Refill: 0 - Insulin Pen Needle (PEN NEEDLES) 31G X 8 MM MISC; UAD  Dispense: 100 each; Refill: 0  3. Primary hypertension: - Continue Losartan-Hydrochlorothiazide as prescribed.  - Counseled on blood pressure goal of less than 130/80, low-sodium, DASH diet, medication compliance, 150 minutes of moderate  intensity exercise per week as tolerated. Discussed medication compliance, adverse effects. - Follow-up with primary provider in 3 months or sooner if needed.  - losartan-hydrochlorothiazide (HYZAAR) 100-25 MG tablet; Take 1 tablet by mouth daily.  Dispense: 90 tablet; Refill: 0    Patient was given clear instructions to go to Emergency Department or return to medical center if symptoms don't improve, worsen, or new problems develop.The patient verbalized understanding.  I discussed the assessment and treatment plan with the patient. The patient was provided an opportunity to ask questions and all were answered. The patient agreed with the plan and demonstrated an understanding of the  instructions.   The patient was advised to call back or seek an in-person evaluation if the symptoms worsen or if the condition fails to improve as anticipated.    Durene Fruits, NP 07/31/2021, 11:25 AM Primary Care at [facility]

## 2021-07-31 ENCOUNTER — Encounter: Payer: Self-pay | Admitting: Family

## 2021-07-31 ENCOUNTER — Other Ambulatory Visit: Payer: Self-pay

## 2021-07-31 ENCOUNTER — Ambulatory Visit (INDEPENDENT_AMBULATORY_CARE_PROVIDER_SITE_OTHER): Payer: 59 | Admitting: Family

## 2021-07-31 VITALS — BP 129/85 | HR 86 | Temp 98.3°F | Resp 18 | Ht 67.52 in | Wt 199.0 lb

## 2021-07-31 DIAGNOSIS — E1159 Type 2 diabetes mellitus with other circulatory complications: Secondary | ICD-10-CM | POA: Insufficient documentation

## 2021-07-31 DIAGNOSIS — E1165 Type 2 diabetes mellitus with hyperglycemia: Secondary | ICD-10-CM | POA: Diagnosis not present

## 2021-07-31 DIAGNOSIS — Z860100 Personal history of colon polyps, unspecified: Secondary | ICD-10-CM | POA: Insufficient documentation

## 2021-07-31 DIAGNOSIS — I10 Essential (primary) hypertension: Secondary | ICD-10-CM | POA: Diagnosis not present

## 2021-07-31 DIAGNOSIS — N200 Calculus of kidney: Secondary | ICD-10-CM | POA: Insufficient documentation

## 2021-07-31 DIAGNOSIS — Z7689 Persons encountering health services in other specified circumstances: Secondary | ICD-10-CM | POA: Diagnosis not present

## 2021-07-31 DIAGNOSIS — Z8616 Personal history of COVID-19: Secondary | ICD-10-CM | POA: Insufficient documentation

## 2021-07-31 DIAGNOSIS — E669 Obesity, unspecified: Secondary | ICD-10-CM | POA: Insufficient documentation

## 2021-07-31 LAB — POCT GLYCOSYLATED HEMOGLOBIN (HGB A1C): Hemoglobin A1C: 13.5 % — AB (ref 4.0–5.6)

## 2021-07-31 MED ORDER — LOSARTAN POTASSIUM-HCTZ 100-25 MG PO TABS: 1.0000 | ORAL_TABLET | Freq: Every day | ORAL | 0 refills | Status: DC

## 2021-07-31 MED ORDER — INSULIN GLARGINE 100 UNIT/ML ~~LOC~~ SOLN: 10.0000 [IU] | Freq: Every day | SUBCUTANEOUS | 0 refills | Status: DC

## 2021-07-31 MED ORDER — PEN NEEDLES 31G X 8 MM MISC: 0 refills | Status: DC

## 2021-07-31 MED ORDER — METFORMIN HCL ER 500 MG PO TB24: 1000.0000 mg | ORAL_TABLET | Freq: Every day | ORAL | 0 refills | Status: DC

## 2021-07-31 MED ORDER — METFORMIN HCL ER (MOD) 500 MG PO TB24: 1000.0000 mg | ORAL_TABLET | Freq: Every day | ORAL | 0 refills | Status: DC

## 2021-07-31 NOTE — Progress Notes (Signed)
Diabetes discussed in office.

## 2021-07-31 NOTE — Progress Notes (Signed)
Pt presents to establish care, has not seen a provider in regards to diabetes in over year  09/07/19- 7.4%  today's visit-13.5%  Needs refill on losartan-hctz

## 2021-07-31 NOTE — Patient Instructions (Signed)
Thank you for choosing [facility] at [facility] for your medical home!    Lee Greene was seen by Camillia Herter, NP today.   Lee Greene's primary care provider is Durene Fruits, NP.   For the best care possible,  you should try to see Durene Fruits, NP whenever you come to clinic.   We look forward to seeing you again soon!  If you have any questions about your visit today,  please call us at [phone]  Or feel free to reach your provider via [place].    Keeping you healthy   Get these tests Blood pressure- Have your blood pressure checked once a year by your healthcare provider.  Normal blood pressure is 120/80. Weight- Have your body mass index (BMI) calculated to screen for obesity.  BMI is a measure of body fat based on height and weight. You can also calculate your own BMI at [employer]velBags.it. Cholesterol- Have your cholesterol checked regularly starting at age 42, sooner may be necessary if you have diabetes, high blood pressure, if a family member developed heart diseases at an early age or if you smoke.  Chlamydia, HIV, and other sexual transmitted disease- Get screened each year until the age of 83 then within three months of each new sexual partner. Diabetes- Have your blood sugar checked regularly if you have high blood pressure, high cholesterol, a family history of diabetes or if you are overweight.   Get these vaccines Flu shot- Every fall. Tetanus shot- Every 10 years. Menactra- Single dose; prevents meningitis.   Take these steps Don't smoke- If you do smoke, ask your healthcare provider about quitting. For tips on how to quit, go to www.smokefree.gov or call 1-800-QUIT-NOW. Be physically active- Exercise 5 days a week for at least 30 minutes.  If you are not already physically active start slow and gradually work up to 30 minutes of moderate physical activity.  Examples of moderate activity include walking briskly, mowing the yard,  dancing, swimming bicycling, etc. Eat a healthy diet- Eat a variety of healthy foods such as fruits, vegetables, low fat milk, low fat cheese, yogurt, lean meats, poultry, fish, beans, tofu, etc.  For more information on healthy eating, go to www.thenutritionsource.org Drink alcohol in moderation- Limit alcohol intake two drinks or less a day.  Never drink and drive. Dentist- Brush and floss teeth twice daily; visit your dentis twice a year. Depression-Your emotional health is as important as your physical health.  If you're feeling down, losing interest in things you normally enjoy please talk with your healthcare provider. Gun Safety- If you keep a gun in your home, keep it unloaded and with the safety lock on.  Bullets should be stored separately. Helmet use- Always wear a helmet when riding a motorcycle, bicycle, rollerblading or skateboarding. Safe sex- If you may be exposed to a sexually transmitted infection, use a condom Seat belts- Seat bels can save your life; always wear one. Smoke/Carbon Monoxide detectors- These detectors need to be installed on the appropriate level of your home.  Replace batteries at least once a year. Skin Cancer- When out in the sun, cover up and use sunscreen SPF 15 or higher. Violence- If anyone is threatening or hurting you, please tell your healthcare provider. Marland Kitchen

## 2021-08-01 LAB — BASIC METABOLIC PANEL
BUN/Creatinine Ratio: 14 (ref 9–20)
BUN: 16 mg/dL (ref 6–24)
CO2: 18 mmol/L — ABNORMAL LOW (ref 20–29)
Calcium: 10 mg/dL (ref 8.7–10.2)
Chloride: 97 mmol/L (ref 96–106)
Creatinine, Ser: 1.12 mg/dL (ref 0.76–1.27)
Glucose: 321 mg/dL — ABNORMAL HIGH (ref 70–99)
Potassium: 4.1 mmol/L (ref 3.5–5.2)
Sodium: 136 mmol/L (ref 134–144)
eGFR: 76 mL/min/{1.73_m2} (ref >59)

## 2021-08-01 NOTE — Progress Notes (Signed)
Kidney function normal

## 2021-08-02 ENCOUNTER — Other Ambulatory Visit: Payer: Self-pay

## 2021-08-02 DIAGNOSIS — E1165 Type 2 diabetes mellitus with hyperglycemia: Secondary | ICD-10-CM

## 2021-08-02 MED ORDER — METFORMIN HCL ER 500 MG PO TB24: 1000.0000 mg | ORAL_TABLET | Freq: Every day | ORAL | 0 refills | Status: DC

## 2021-08-02 NOTE — Progress Notes (Signed)
Due to insurance purpose Rx updated to 90 day supply

## 2021-08-19 ENCOUNTER — Other Ambulatory Visit: Payer: Self-pay

## 2021-08-19 DIAGNOSIS — E119 Type 2 diabetes mellitus without complications: Secondary | ICD-10-CM

## 2021-08-19 MED ORDER — FREESTYLE LIBRE 14 DAY READER DEVI: 1.0000 | Freq: Three times a day (TID) | 1 refills | Status: DC

## 2021-08-19 MED ORDER — FREESTYLE LIBRE 14 DAY SENSOR MISC: 1.0000 | 3 refills | Status: DC

## 2021-08-19 NOTE — Progress Notes (Signed)
Freestyle libre sent into pharmacy

## 2021-08-23 NOTE — Progress Notes (Signed)
Patient ID: MOODY ROBBEN, male    DOB: 08-07-1960  MRN: [id number]  CC: Diabetes Follow-Up  Subjective: Lee Greene is a 61 y.o. male who presents for diabetes follow-up.   His concerns today include:  DIABETES TYPE 2 FOLLOW-UP: 07/31/2021: - Hemoglobin A1c today not at goal at 13.5%, goal < 7%. This is increased from previous 7.3% on 09/08/2019. - Patient intolerant to Metformin (Glumetza)1000 mg twice daily as this causes diarrhea. Patient able to tolerate Metformin (Glumetza) 1000 mg once daily.  - Will change Metformin (Glumetza) to Metformin XR with goal of decreasing side effects of diarrhea.  - Begin Insulin Glargine as prescribed.   08/27/2021: Doing well on Metformin, no issues/concerns. Diarrhea improved. Taking Metformin 2 tablets each morning and then an additional Metformin 1 tablet every other evening. Home blood sugars usually 140's-180's. Sometimes blood sugars 200's.   2. HYPERTENSION: Requesting refills.   3. LEFT WRIST PAIN: Reports fell 6 months ago. Was not evaluated at emergency department. Caught himself when he fell and left wrist bent backwards. Initially swollen since then fluctuates. Having intermittent wrist pain. Decreased strength when picking up objects. Taking Ibuprofen when needed and helps.    Patient Active Problem List   Diagnosis Date Noted   Nephrolithiasis 07/31/2021   Obesity 07/31/2021   Type 2 diabetes mellitus with other circulatory complications ([place]) 83/15/1761   Personal history of colonic polyps 07/31/2021   Personal history of COVID-19 07/31/2021   History of colonic polyps 07/02/2021   Peripheral vascular disorder due to diabetes mellitus ([place]) 07/02/2021   History of COVID-19 08/24/2019   Overweight (BMI 25.0-29.9) 03/20/2017   Exertional chest pain 04/29/2013   Hypertension 04/29/2013   Diabetes mellitus without complication ([place]) 60/73/7106   Erectile dysfunction 02/03/2013   Physical exam, annual 01/29/2012    HTN (hypertension) 04/12/2010   Migraine 10/05/2008   RHINITIS 10/05/2008     Current Outpatient Medications on File Prior to Visit  Medication Sig Dispense Refill   Continuous Blood Gluc Receiver (FREESTYLE LIBRE 14 DAY READER) DEVI 1 each by Does not apply route 3 (three) times daily. Use to test CGM as directed.  DX E11.9 1 each 1   Continuous Blood Gluc Sensor (FREESTYLE LIBRE 14 DAY SENSOR) MISC 1 each by Does not apply route every 14 (fourteen) days. 3 each 3   ibuprofen (ADVIL) 600 MG tablet Take 1 tablet (600 mg total) by mouth every 6 (six) hours as needed. 30 tablet 0   [DISCONTINUED] cetirizine (ZYRTEC) 10 MG tablet Take 1 tablet (10 mg total) by mouth daily. 30 tablet 11   [DISCONTINUED] fluticasone (FLONASE) 50 MCG/ACT nasal spray Place 2 sprays into both nostrils daily. 16 g 6   No current facility-administered medications on file prior to visit.    No Known Allergies  Social History   Socioeconomic History   Marital status: Married    Spouse name: Not on file   Number of children: Not on file   Years of education: Not on file   Highest education level: Not on file  Occupational History   Not on file  Tobacco Use   Smoking status: Never    Passive exposure: Never   Smokeless tobacco: Never  Vaping Use   Vaping Use: Never used  Substance and Sexual Activity   Alcohol use: No    Alcohol/week: 0.0 standard drinks   Drug use: No   Sexual activity: Yes  Other Topics Concern   Not on file  Social History  Narrative   Not on file   Social Determinants of Health   Financial Resource Strain: Not on file  Food Insecurity: Not on file  Transportation Needs: Not on file  Physical Activity: Not on file  Stress: Not on file  Social Connections: Not on file  Intimate Partner Violence: Not on file    Family History  Problem Relation Age of Onset   Hypertension Father    Diabetes Father    Colon cancer Neg Hx    Colon polyps Neg Hx    Esophageal cancer Neg Hx     Rectal cancer Neg Hx    Stomach cancer Neg Hx     Past Surgical History:  Procedure Laterality Date   HAND SURGERY     1990's    ROS: Review of Systems Negative except as stated above  PHYSICAL EXAM: BP 125/83 (BP Location: Left Arm, Patient Position: Sitting, Cuff Size: Normal)    Pulse 86    Temp 98.5 F (36.9 C)    Resp 18    Ht 5' 7.52" (1.715 m)    Wt 200 lb (90.7 kg)    SpO2 95%    BMI 30.84 kg/m   Physical Exam HENT:     Head: Normocephalic and atraumatic.  Eyes:     Extraocular Movements: Extraocular movements intact.     Conjunctiva/sclera: Conjunctivae normal.     Pupils: Pupils are equal, round, and reactive to light.  Cardiovascular:     Rate and Rhythm: Normal rate and regular rhythm.     Pulses: Normal pulses.     Heart sounds: Normal heart sounds.  Pulmonary:     Effort: Pulmonary effort is normal.     Breath sounds: Normal breath sounds.  Musculoskeletal:     Left wrist: Normal.     Cervical back: Normal range of motion and neck supple.  Neurological:     General: No focal deficit present.     Mental Status: He is alert and oriented to person, place, and time.  Psychiatric:        Mood and Affect: Mood normal.        Behavior: Behavior normal.    Results for orders placed or performed in visit on 08/27/21  POCT glycosylated hemoglobin (Hb A1C)  Result Value Ref Range   Hemoglobin A1C 12.5 (A) 4.0 - 5.6 %   HbA1c POC (<> result, manual entry)     HbA1c, POC (prediabetic range)     HbA1c, POC (controlled diabetic range)      ASSESSMENT AND PLAN: 1. Uncontrolled type 2 diabetes mellitus with hyperglycemia ([place]): - Hemoglobin A1c today not at goal at 12.5%, goal < 7%.  - Increase Metformin XR from 1000 mg once daily to 1000 mg twice daily.  - Increase Insulin Glargine from 10 units daily to 12 units for 7 days then 15 units continuing.  - Discussed the importance of healthy eating habits, low-carbohydrate diet, low-sugar diet, regular aerobic  exercise (at least 150 minutes a week as tolerated) and medication compliance to achieve or maintain control of diabetes. - Referral to Endocrinology for further evaluation and management. - Follow-up with primary provider as scheduled.  - POCT glycosylated hemoglobin (Hb A1C) - metFORMIN (GLUCOPHAGE XR) 500 MG 24 hr tablet; Take 2 tablets (1,000 mg total) by mouth 2 (two) times daily with a meal.  Dispense: 360 tablet; Refill: 0 - insulin glargine (LANTUS) 100 UNIT/ML injection; Inject 0.12 mLs (12 Units total) into the skin at bedtime for  7 days, THEN 0.15 mLs (15 Units total) at bedtime.  Dispense: 13.29 mL; Refill: 0 - Insulin Pen Needle (PEN NEEDLES) 31G X 8 MM MISC; UAD  Dispense: 100 each; Refill: 0 - Ambulatory referral to Endocrinology  2. Essential (primary) hypertension: - Refills provided as requested.  - Follow-up as scheduled.  - losartan-hydrochlorothiazide (HYZAAR) 100-25 MG tablet; Take 1 tablet by mouth daily.  Dispense: 90 tablet; Refill: 0  3. Chronic wrist pain, left: - Diagnostic x-ray left wrist for further evaluation.  - Continue over-the-counter analgesics as needed.  - Follow-up with primary provider as scheduled. - DG Wrist Complete Left; Future    Patient was given the opportunity to ask questions.  Patient verbalized understanding of the plan and was able to repeat key elements of the plan. Patient was given clear instructions to go to Emergency Department or return to medical center if symptoms don't improve, worsen, or new problems develop.The patient verbalized understanding.   Orders Placed This Encounter  Procedures   DG Wrist Complete Left   POCT glycosylated hemoglobin (Hb A1C)     Requested Prescriptions   Signed Prescriptions Disp Refills   losartan-hydrochlorothiazide (HYZAAR) 100-25 MG tablet 90 tablet 0    Sig: Take 1 tablet by mouth daily.   metFORMIN (GLUCOPHAGE XR) 500 MG 24 hr tablet 360 tablet 0    Sig: Take 2 tablets (1,000 mg  total) by mouth 2 (two) times daily with a meal.   insulin glargine (LANTUS) 100 UNIT/ML injection 13.29 mL 0    Sig: Inject 0.12 mLs (12 Units total) into the skin at bedtime for 7 days, THEN 0.15 mLs (15 Units total) at bedtime.   Insulin Pen Needle (PEN NEEDLES) 31G X 8 MM MISC 100 each 0    Sig: UAD    Follow-up with primary provider as scheduled.   Camillia Herter, NP

## 2021-08-27 ENCOUNTER — Other Ambulatory Visit: Payer: Self-pay

## 2021-08-27 ENCOUNTER — Ambulatory Visit (INDEPENDENT_AMBULATORY_CARE_PROVIDER_SITE_OTHER): Payer: 59 | Admitting: Family

## 2021-08-27 ENCOUNTER — Encounter: Payer: Self-pay | Admitting: Family

## 2021-08-27 ENCOUNTER — Ambulatory Visit (INDEPENDENT_AMBULATORY_CARE_PROVIDER_SITE_OTHER): Payer: 59

## 2021-08-27 VITALS — BP 125/83 | HR 86 | Temp 98.5°F | Resp 18 | Ht 67.52 in | Wt 200.0 lb

## 2021-08-27 DIAGNOSIS — M25532 Pain in left wrist: Secondary | ICD-10-CM

## 2021-08-27 DIAGNOSIS — E1165 Type 2 diabetes mellitus with hyperglycemia: Secondary | ICD-10-CM | POA: Diagnosis not present

## 2021-08-27 DIAGNOSIS — G8929 Other chronic pain: Secondary | ICD-10-CM

## 2021-08-27 DIAGNOSIS — I10 Essential (primary) hypertension: Secondary | ICD-10-CM | POA: Diagnosis not present

## 2021-08-27 LAB — POCT GLYCOSYLATED HEMOGLOBIN (HGB A1C): Hemoglobin A1C: 12.5 % — AB (ref 4.0–5.6)

## 2021-08-27 MED ORDER — INSULIN GLARGINE 100 UNIT/ML ~~LOC~~ SOLN: SUBCUTANEOUS | 0 refills | Status: DC

## 2021-08-27 MED ORDER — METFORMIN HCL ER 500 MG PO TB24: 1000.0000 mg | ORAL_TABLET | Freq: Two times a day (BID) | ORAL | 0 refills | Status: DC

## 2021-08-27 MED ORDER — LOSARTAN POTASSIUM-HCTZ 100-25 MG PO TABS: 1.0000 | ORAL_TABLET | Freq: Every day | ORAL | 0 refills | Status: DC

## 2021-08-27 MED ORDER — PEN NEEDLES 31G X 8 MM MISC: 0 refills | Status: DC

## 2021-08-27 NOTE — Progress Notes (Signed)
Diabetes discussed in office.

## 2021-08-27 NOTE — Progress Notes (Signed)
Pt presents for diabetes follow-up, pt states about 6 months ago fell on left wrist and intermittently the pain comes and goes along with edema   Pt states refill is needed on Metformin and Losartan 90 day supply given 07/31/21

## 2021-08-27 NOTE — Progress Notes (Signed)
Arthritis of left wrist. Recommendation for rest, ice, and compression as needed. Continue over-the-counter Ibuprofen and Tylenol. Follow-up with primary provider as scheduled.

## 2021-09-03 ENCOUNTER — Other Ambulatory Visit: Payer: Self-pay | Admitting: Family

## 2021-09-03 DIAGNOSIS — E1165 Type 2 diabetes mellitus with hyperglycemia: Secondary | ICD-10-CM

## 2021-09-03 MED ORDER — INSULIN DETEMIR 100 UNIT/ML FLEXPEN: PEN_INJECTOR | SUBCUTANEOUS | 0 refills | Status: DC

## 2021-10-02 ENCOUNTER — Ambulatory Visit (INDEPENDENT_AMBULATORY_CARE_PROVIDER_SITE_OTHER): Payer: 59 | Admitting: Internal Medicine

## 2021-10-02 ENCOUNTER — Other Ambulatory Visit: Payer: Self-pay

## 2021-10-02 ENCOUNTER — Encounter: Payer: Self-pay | Admitting: Internal Medicine

## 2021-10-02 DIAGNOSIS — E1165 Type 2 diabetes mellitus with hyperglycemia: Secondary | ICD-10-CM | POA: Diagnosis not present

## 2021-10-02 MED ORDER — METFORMIN HCL ER 500 MG PO TB24: 1000.0000 mg | ORAL_TABLET | Freq: Two times a day (BID) | ORAL | 3 refills | Status: DC

## 2021-10-02 MED ORDER — INSULIN DETEMIR 100 UNIT/ML FLEXPEN: 15.0000 [IU] | PEN_INJECTOR | Freq: Every day | SUBCUTANEOUS | 1 refills | Status: DC

## 2021-10-02 MED ORDER — INSULIN PEN NEEDLE 32G X 4 MM MISC: 1.0000 | Freq: Every day | 3 refills | Status: AC

## 2021-10-02 NOTE — Progress Notes (Signed)
? ? ? ? ?Name: Lee Greene  ?Age/ Sex: 61 y.o., male   ?MRN/ DOB: [id number], 1961-05-15    ? ?PCP: Camillia Herter, NP   ?Reason for Endocrinology Evaluation: Type 2 Diabetes Mellitus  ?   ?Initial Endocrinology Clinic Visit: 10/01/2018  ? ? ?PATIENT IDENTIFIER: [name] is a 61 y.o. male with a past medical history of HTN and T2DM. The patient has followed with Endocrinology clinic since 10/01/2018 for consultative assistance with management of his diabetes. ? ?DIABETIC HISTORY:  ?Lee Greene was diagnosed with T2DM in  2013. He was on oral glycemic agents since his diagnosis. His hemoglobin A1c has ranged from 6.7% in 2013, peaking at 17.4%  In 2020. ? ?On his initial visit to our clinic his A1c was 17.4 %. He was on Metformin. He was started on Lantus in 09/2018 but stopped in 11/2018 ?Started Wilder Glade 11/2018 but was stopped in 2022 due to fatigue  ? ? ? ?He was started on Insulin 07/2021 with an A1c 13.5%  ? ?SUBJECTIVE:  ? ?During the last visit (02/11/2019): We continued Metformin, started farxiga, lantus was stopped ? ? ?Today (10/02/2021): Lee Greene is here for a  follow up on his diabetes management.  He has not been to our clinic in 32 months. He checks his blood sugars multiple times daily,through CGM. The patient has not had hypoglycemic episodes since the last clinic visit.  ? ?Denies nausea, vomiting or diarrhea  ?No hx of pancreatitis  ? ? ?HOME DIABETES REGIMEN:  ?Metformin 500 mg XR 2 Tabs BID ?Levemir 15 units daily  ? ? ? ?CONTINUOUS GLUCOSE MONITORING RECORD INTERPRETATION   ? ?Dates of Recording: 3/9 - 10/02/2021 ? ?Sensor description: Freestyle libre ? ?Results statistics: ?  ?CGM use % of time 22%  ?Average and SD 137/20.2  ?Time in range    91    %  ?% Time Above 180 9  ?% Time above 250 0  ?% Time Below target 0  ? ? ? ? ?Glycemic patterns summary: Limited data with 22% use of CGM ? ?Hyperglycemic episodes postprandial ? ?Hypoglycemic episodes occurred N/A ? ?Overnight  periods: At goal ? ? ? ? ? ? ? ? ?HISTORY:  ?Past Medical History:  ?Past Medical History:  ?Diagnosis Date  ? Chest pain   ? no issues since 2014   ? Diabetes mellitus without complication ([place])   ? diet controlled, no medicines   ? Hypertension   ? off BP medicines x 3 months as of 07-03-15  ? ?Past Surgical History:  ?Past Surgical History:  ?Procedure Laterality Date  ? HAND SURGERY    ? 1990's  ? ?Social History:  reports that he has never smoked. He has never been exposed to tobacco smoke. He has never used smokeless tobacco. He reports that he does not drink alcohol and does not use drugs. ?Family History:  ?Family History  ?Problem Relation Age of Onset  ? Hypertension Father   ? Diabetes Father   ? Colon cancer Neg Hx   ? Colon polyps Neg Hx   ? Esophageal cancer Neg Hx   ? Rectal cancer Neg Hx   ? Stomach cancer Neg Hx   ? ? ? ?HOME MEDICATIONS: ?Allergies as of 10/02/2021   ?No Known Allergies ?  ? ?  ?Medication List  ?  ? ?  ? Accurate as of October 02, 2021 10:41 AM. If you have any questions, ask your nurse or doctor.  ?  ?  ? ?  ? ?  FreeStyle Libre 14 Day Reader Kerrin Mo ?1 each by Does not apply route 3 (three) times daily. Use to test CGM as directed.  DX E11.9 ?  ?FreeStyle Libre 14 Day Sensor Misc ?1 each by Does not apply route every 14 (fourteen) days. ?  ?ibuprofen 600 MG tablet ?Commonly known as: ADVIL ?Take 1 tablet (600 mg total) by mouth every 6 (six) hours as needed. ?  ?insulin detemir 100 UNIT/ML FlexPen ?Commonly known as: LEVEMIR ?Inject 12 Units into the skin at bedtime for 7 days, THEN 15 Units at bedtime. ?Start taking on: September 03, 2021 ?  ?losartan-hydrochlorothiazide 100-25 MG tablet ?Commonly known as: HYZAAR ?Take 1 tablet by mouth daily. ?  ?metFORMIN 500 MG 24 hr tablet ?Commonly known as: Glucophage XR ?Take 2 tablets (1,000 mg total) by mouth 2 (two) times daily with a meal. ?  ?Pen Needles 31G X 8 MM Misc ?UAD ?  ? ?  ? ? ?OBJECTIVE:  ?  ?VITAL SIGNS:BP 124/86 (BP Location:  Left Arm, Patient Position: Sitting, Cuff Size: Large)   Pulse 78   Ht 5' 7.5" (1.715 m)   Wt 199 lb 3.2 oz (90.4 kg)   SpO2 99%   BMI 30.74 kg/m?  ? ?PHYSICAL EXAM:  ?General: Pt appears well and is in NAD  ?Neck: General: Supple without adenopathy or carotid bruits. ?Thyroid: Thyroid size normal.  No goiter or nodules appreciated. No thyroid bruit.  ?Lungs: Clear with good BS bilat with no rales, rhonchi, or wheezes  ?Heart: RRR with normal S1 and S2 and no gallops; no murmurs; no rub  ?Abdomen: Normoactive bowel sounds, soft, nontender, without masses or organomegaly palpable  ?Extremities:   ?Lower extremities - No pretibial edema. No lesions.  ?Skin: Normal texture and temperature to palpation. No rash noted.   ?Neuro: MS is good with appropriate affect, pt is alert and Ox3  ?  ?  ?DM foot exam: 10/01/18 ? ?The skin of the feet is intact without sores or ulcerations. ?The pedal pulses are 2+ on right and 2+ on left. ?The sensation is intact to a screening 5.07, 10 gram monofilament bilaterally ? ? ?DATA REVIEWED: ? ?Lab Results  ?Component Value Date  ? HGBA1C 12.5 (A) 08/27/2021  ? HGBA1C 13.5 (A) 07/31/2021  ? HGBA1C 7.3 (H) 09/07/2019  ? ? Latest Reference Range & Units 07/31/21 15:35  ?Sodium 134 - 144 mmol/L 136  ?Potassium 3.5 - 5.2 mmol/L 4.1  ?Chloride 96 - 106 mmol/L 97  ?CO2 20 - 29 mmol/L 18 (L)  ?Glucose 70 - 99 mg/dL 321 (H)  ?BUN 6 - 24 mg/dL 16  ?Creatinine 0.76 - 1.27 mg/dL 1.12  ?Calcium 8.7 - 10.2 mg/dL 10.0  ?BUN/Creatinine Ratio 9 - 20  14  ?eGFR >59 mL/min/1.73 76  ? ? ? ? Latest Reference Range & Units 08/27/21 10:56  ?Hemoglobin A1C 4.0 - 5.6 % 12.5 !  ? ? ? ? ? ?ASSESSMENT / PLAN / RECOMMENDATIONS:  ? ?1) Type 2 Diabetes Mellitus, poorly controlled, Without complications - Most recent A1c of 12.5 %. Goal A1c < 7.0 %.  ? ?-Unfortunately patient with hyperglycemia and an A1c of 12.5% ?-He is currently on Levemir, which we will continue ?-We discussed GLP-1 agonist, he has no history of  pancreatitis, cautioned against GI side effects ?-Could not tolerate Farxiga due to fatigue ?-He was given a 30-day sample of Rybelsus 3 mg ? ?MEDICATIONS: ?Start Rybelsus 30 mg daily for 30 days, then increase to 7 mg daily ?  Continue Metformin 500 mg 2 tabs BID ?Continue Levemir 15 units daily ? ?EDUCATION / INSTRUCTIONS: ?BG monitoring instructions: Patient is instructed to check his blood sugars 2 times a day, fasting and bedtime. ?Call [facility] if: BG persistently < 70 or > 300. ?I reviewed the Rule of 15 for the treatment of hypoglycemia in detail with the patient. Literature supplied. ? ? ? ? ? ? ?F/U in 3 months  ? ? ?Signed electronically by: ?Abby Nena Jordan, MD ? ?[facility]  [facility] ?[address] ?[place], [place] [address] ?Phone: [phone] ?FAX: [phone] ? ? ?CC: ?Camillia Herter, NP ?[address] ?[place] [address] ?Phone: [phone]  ?Fax: [phone] ? ?Return to Endocrinology clinic as below: ?No future appointments. ?  ? ? ? ?

## 2021-10-02 NOTE — Patient Instructions (Signed)
-   Start Rybelsus 3 mg daily , 1 tablet before Breakfast , after a month increase to 7 mg  ?- Continue Metformin 500 mg 2 tablets before Breakfast and 2 tablets before Supper  ?- Continue Levemir 15 units once daily   ? ? ?HOW TO TREAT LOW BLOOD SUGARS (Blood sugar LESS THAN 70 MG/DL) ?Please follow the RULE OF 15 for the treatment of hypoglycemia treatment (when your (blood sugars are less than 70 mg/dL)  ? ?STEP 1: Take 15 grams of carbohydrates when your blood sugar is low, which includes:  ?3-4 GLUCOSE TABS  OR ?3-4 OZ OF JUICE OR REGULAR SODA OR ?ONE TUBE OF GLUCOSE GEL   ? ?STEP 2: RECHECK blood sugar in 15 MINUTES ?STEP 3: If your blood sugar is still low at the 15 minute recheck --> then, go back to STEP 1 and treat AGAIN with another 15 grams of carbohydrates. ? ?

## 2021-10-03 ENCOUNTER — Encounter: Payer: Self-pay | Admitting: Internal Medicine

## 2021-10-03 ENCOUNTER — Telehealth: Payer: Self-pay | Admitting: Internal Medicine

## 2021-10-17 NOTE — Telephone Encounter (Signed)
error 

## 2021-12-26 ENCOUNTER — Ambulatory Visit
Admission: EM | Admit: 2021-12-26 | Discharge: 2021-12-26 | Disposition: A | Discharge status: Home / Self Care | Payer: 59 | Attending: Family Medicine | Admitting: Family Medicine

## 2021-12-26 ENCOUNTER — Ambulatory Visit (INDEPENDENT_AMBULATORY_CARE_PROVIDER_SITE_OTHER): Payer: 59

## 2021-12-26 DIAGNOSIS — R059 Cough, unspecified: Secondary | ICD-10-CM | POA: Diagnosis not present

## 2021-12-26 DIAGNOSIS — J189 Pneumonia, unspecified organism: Secondary | ICD-10-CM

## 2021-12-26 DIAGNOSIS — R0600 Dyspnea, unspecified: Secondary | ICD-10-CM

## 2021-12-26 MED ORDER — DOXYCYCLINE HYCLATE 100 MG PO CAPS: 100.0000 mg | ORAL_CAPSULE | Freq: Two times a day (BID) | ORAL | 0 refills | Status: AC

## 2021-12-26 MED ORDER — ALBUTEROL SULFATE HFA 108 (90 BASE) MCG/ACT IN AERS: 1.0000 | INHALATION_SPRAY | RESPIRATORY_TRACT | 0 refills | Status: DC | PRN

## 2021-12-26 NOTE — ED Provider Notes (Signed)
[facility]    CSN: [id number] Arrival date & time: 12/26/21  1447      History   Chief Complaint Chief Complaint  Patient presents with   Shortness of Breath    HPI Lee Greene is a 61 y.o. male.    Shortness of Breath  Here for shortness of breath and some cough and congestion that began about 7 or 8 days ago.  The shortness of breath will last an hour or so and does come and go.  Maybe had some fever a few days ago.  No vomiting  He does have a history of hypertension and diabetes, and he states that sugars been well controlled of late.  No chest pain  He does have a history of asthma, but he does not have an inhaler at home  Past Medical History:  Diagnosis Date   Chest pain    no issues since 2014    Diabetes mellitus without complication ([place])    diet controlled, no medicines    Hypertension    off BP medicines x 3 months as of 07-03-15    Patient Active Problem List   Diagnosis Date Noted   Nephrolithiasis 07/31/2021   Obesity 07/31/2021   Type 2 diabetes mellitus with other circulatory complications ([place]) 56/43/3295   Personal history of colonic polyps 07/31/2021   Personal history of COVID-19 07/31/2021   History of colonic polyps 07/02/2021   Peripheral vascular disorder due to diabetes mellitus ([place]) 07/02/2021   History of COVID-19 08/24/2019   Overweight (BMI 25.0-29.9) 03/20/2017   Exertional chest pain 04/29/2013   Hypertension 04/29/2013   Diabetes mellitus without complication ([place]) 18/84/1660   Erectile dysfunction 02/03/2013   Physical exam, annual 01/29/2012   HTN (hypertension) 04/12/2010   Migraine 10/05/2008   RHINITIS 10/05/2008    Past Surgical History:  Procedure Laterality Date   HAND SURGERY     1990's       Home Medications    Prior to Admission medications   Medication Sig Start Date End Date Taking? Authorizing Provider  albuterol (VENTOLIN HFA) 108 (90 Base) MCG/ACT inhaler Inhale 1-2 puffs  into the lungs every 4 (four) hours as needed for wheezing or shortness of breath. 12/26/21  Yes Dyan Labarbera, Gwenlyn Perking, MD  doxycycline (VIBRAMYCIN) 100 MG capsule Take 1 capsule (100 mg total) by mouth 2 (two) times daily for 7 days. 12/26/21 01/02/22 Yes Barrett Henle, MD  Continuous Blood Gluc Receiver (FREESTYLE LIBRE 14 DAY READER) DEVI 1 each by Does not apply route 3 (three) times daily. Use to test CGM as directed.  DX E11.9 08/19/21   Camillia Herter, NP  Continuous Blood Gluc Sensor (FREESTYLE LIBRE 14 DAY SENSOR) MISC 1 each by Does not apply route every 14 (fourteen) days. 08/19/21   Camillia Herter, NP  ibuprofen (ADVIL) 600 MG tablet Take 1 tablet (600 mg total) by mouth every 6 (six) hours as needed. 08/24/20   Varney Biles, MD  insulin detemir (LEVEMIR) 100 UNIT/ML FlexPen Inject 15 Units into the skin at bedtime. 10/02/21   Shamleffer, Melanie Crazier, MD  Insulin Pen Needle 32G X 4 MM MISC 1 Device by Does not apply route daily in the afternoon. 10/02/21   Shamleffer, Melanie Crazier, MD  losartan-hydrochlorothiazide (HYZAAR) 100-25 MG tablet Take 1 tablet by mouth daily. 08/27/21   Camillia Herter, NP  metFORMIN (GLUCOPHAGE XR) 500 MG 24 hr tablet Take 2 tablets (1,000 mg total) by mouth 2 (two) times daily  with a meal. 10/02/21   Shamleffer, Melanie Crazier, MD  cetirizine (ZYRTEC) 10 MG tablet Take 1 tablet (10 mg total) by mouth daily. 12/14/19 11/01/20  Midge Minium, MD  fluticasone (FLONASE) 50 MCG/ACT nasal spray Place 2 sprays into both nostrils daily. 12/14/19 11/01/20  Midge Minium, MD    Family History Family History  Problem Relation Age of Onset   Hypertension Father    Diabetes Father    Colon cancer Neg Hx    Colon polyps Neg Hx    Esophageal cancer Neg Hx    Rectal cancer Neg Hx    Stomach cancer Neg Hx     Social History Social History   Tobacco Use   Smoking status: Never    Passive exposure: Never   Smokeless tobacco: Never  Vaping Use   Vaping  Use: Never used  Substance Use Topics   Alcohol use: No    Alcohol/week: 0.0 standard drinks of alcohol   Drug use: No     Allergies   Patient has no known allergies.   Review of Systems Review of Systems  Respiratory:  Positive for shortness of breath.      Physical Exam Triage Vital Signs ED Triage Vitals  Enc Vitals Group     BP 12/26/21 1514 122/86     Pulse Rate 12/26/21 1514 80     Resp 12/26/21 1514 18     Temp 12/26/21 1514 98 F (36.7 C)     Temp Source 12/26/21 1514 Oral     SpO2 12/26/21 1514 98 %     Weight --      Height --      Head Circumference --      Peak Flow --      Pain Score 12/26/21 1513 0     Pain Loc --      Pain Edu? --      Excl. in [place]? --    No data found.  Updated Vital Signs BP 122/86   Pulse 80   Temp 98 F (36.7 C) (Oral)   Resp 18   SpO2 98%   Visual Acuity Right Eye Distance:   Left Eye Distance:   Bilateral Distance:    Right Eye Near:   Left Eye Near:    Bilateral Near:     Physical Exam Vitals reviewed.  Constitutional:      General: He is not in acute distress.    Appearance: He is not ill-appearing, toxic-appearing or diaphoretic.  HENT:     Nose: Nose normal.     Mouth/Throat:     Mouth: Mucous membranes are moist.     Pharynx: No oropharyngeal exudate or posterior oropharyngeal erythema.  Eyes:     Extraocular Movements: Extraocular movements intact.     Conjunctiva/sclera: Conjunctivae normal.     Pupils: Pupils are equal, round, and reactive to light.  Cardiovascular:     Rate and Rhythm: Normal rate and regular rhythm.     Heart sounds: No murmur heard. Pulmonary:     Effort: Pulmonary effort is normal.     Breath sounds: Normal breath sounds. No stridor. No wheezing, rhonchi or rales.     Comments: He does actually appear a little air hungry when I enter the room. Musculoskeletal:     Cervical back: Neck supple.  Lymphadenopathy:     Cervical: No cervical adenopathy.  Skin:    Capillary  Refill: Capillary refill takes less than 2 seconds.  Coloration: Skin is not jaundiced or pale.  Neurological:     Mental Status: He is alert and oriented to person, place, and time.  Psychiatric:        Behavior: Behavior normal.      UC Treatments / Results  Labs (all labs ordered are listed, but only abnormal results are displayed) Labs Reviewed - No data to display  EKG   Radiology DG Chest 2 View  Result Date: 12/26/2021 CLINICAL DATA:  Dyspnea and cough. EXAM: CHEST - 2 VIEW COMPARISON:  Chest x-ray 07/26/2019. FINDINGS: There is patchy airspace disease in the right mid lung/right upper lobe. There is no pleural effusion or pneumothorax. Cardiomediastinal silhouette is within normal limits. No acute fractures are seen. IMPRESSION: Minimal patchy right upper lobe airspace disease worrisome for pneumonia. Followup PA and lateral chest X-ray is recommended in 4-6 weeks following trial of antibiotic therapy to ensure resolution and exclude underlying pathology. Electronically Signed   By: Ronney Asters M.D.   On: 12/26/2021 15:48    Procedures Procedures (including critical care time)  Medications Ordered in UC Medications - No data to display  Initial Impression / Assessment and Plan / UC Course  I have reviewed the triage vital signs and the nursing notes.  Pertinent labs & imaging results that were available during my care of the patient were reviewed by me and considered in my medical decision making (see chart for details).     Chest x-ray shows some possible pneumonia in the right upper lobe we will treat with antibiotics and give him an inhaler in case he needs it. Final Clinical Impressions(s) / UC Diagnoses   Final diagnoses:  Pneumonia of right upper lobe due to infectious organism     Discharge Instructions      Chest x-ray showed some possible pneumonia in the right upper lobe  Albuterol inhaler--do 2 puffs every 4 hours as needed for shortness of  breath or wheezing  Doxycycline 100 mg--1 capsule 2 times daily for 7 days  Please follow-up with your regular doctor in another week or 2 to make sure that the chest x-ray has cleared       ED Prescriptions     Medication Sig Dispense Auth. Provider   albuterol (VENTOLIN HFA) 108 (90 Base) MCG/ACT inhaler Inhale 1-2 puffs into the lungs every 4 (four) hours as needed for wheezing or shortness of breath. 1 each Barrett Henle, MD   doxycycline (VIBRAMYCIN) 100 MG capsule Take 1 capsule (100 mg total) by mouth 2 (two) times daily for 7 days. 14 capsule Windy Carina, Gwenlyn Perking, MD      PDMP not reviewed this encounter.   Barrett Henle, MD 12/26/21 [phone]

## 2021-12-26 NOTE — Discharge Instructions (Addendum)
Chest x-ray showed some possible pneumonia in the right upper lobe  Albuterol inhaler--do 2 puffs every 4 hours as needed for shortness of breath or wheezing  Doxycycline 100 mg--1 capsule 2 times daily for 7 days  Please follow-up with your regular doctor in another week or 2 to make sure that the chest x-ray has cleared

## 2021-12-26 NOTE — ED Triage Notes (Signed)
Patient presents to Urgent Care with complaints of sob and sinus congestion  since earlier this week . Patient reports no otc medications.

## 2022-01-06 ENCOUNTER — Encounter: Payer: Self-pay | Admitting: Internal Medicine

## 2022-01-06 ENCOUNTER — Ambulatory Visit (INDEPENDENT_AMBULATORY_CARE_PROVIDER_SITE_OTHER): Payer: 59 | Admitting: Internal Medicine

## 2022-01-06 VITALS — BP 132/88 | HR 78 | Ht 67.5 in | Wt 205.0 lb

## 2022-01-06 DIAGNOSIS — E119 Type 2 diabetes mellitus without complications: Secondary | ICD-10-CM

## 2022-01-06 DIAGNOSIS — E781 Pure hyperglyceridemia: Secondary | ICD-10-CM | POA: Diagnosis not present

## 2022-01-06 DIAGNOSIS — Z794 Long term (current) use of insulin: Secondary | ICD-10-CM | POA: Diagnosis not present

## 2022-01-06 LAB — POCT GLYCOSYLATED HEMOGLOBIN (HGB A1C): Hemoglobin A1C: 7.1 % — AB (ref 4.0–5.6)

## 2022-01-06 LAB — LIPID PANEL
Cholesterol: 146 mg/dL (ref 0–200)
HDL: 70.8 mg/dL (ref >39.00)
LDL Cholesterol: 43 mg/dL (ref 0–99)
NonHDL: 74.75
Total CHOL/HDL Ratio: 2
Triglycerides: 157 mg/dL — ABNORMAL HIGH (ref 0.0–149.0)
VLDL: 31.4 mg/dL (ref 0.0–40.0)

## 2022-01-06 LAB — BASIC METABOLIC PANEL
BUN: 15 mg/dL (ref 6–23)
CO2: 29 mEq/L (ref 19–32)
Calcium: 9.9 mg/dL (ref 8.4–10.5)
Chloride: 102 mEq/L (ref 96–112)
Creatinine, Ser: 1.13 mg/dL (ref 0.40–1.50)
GFR: 71.53 mL/min (ref >60.00)
Glucose, Bld: 122 mg/dL — ABNORMAL HIGH (ref 70–99)
Potassium: 3.8 mEq/L (ref 3.5–5.1)
Sodium: 141 mEq/L (ref 135–145)

## 2022-01-06 LAB — MICROALBUMIN / CREATININE URINE RATIO
Creatinine,U: 152.4 mg/dL
Microalb Creat Ratio: 0.5 mg/g (ref 0.0–30.0)
Microalb, Ur: <0.7 mg/dL (ref 0.0–1.9)

## 2022-01-06 MED ORDER — FREESTYLE LIBRE 3 SENSOR MISC: 1.0000 | 3 refills | Status: DC

## 2022-01-06 MED ORDER — RYBELSUS 7 MG PO TABS: 7.0000 mg | ORAL_TABLET | Freq: Every day | ORAL | 3 refills | Status: DC

## 2022-01-31 LAB — HM DIABETES EYE EXAM

## 2022-02-06 ENCOUNTER — Other Ambulatory Visit: Payer: Self-pay | Admitting: Family

## 2022-02-06 DIAGNOSIS — I10 Essential (primary) hypertension: Secondary | ICD-10-CM

## 2022-02-06 NOTE — Telephone Encounter (Signed)
Medication Refill - Medication: losartan-hydrochlorothiazide (HYZAAR) 100-25 MG tablet   Has the patient contacted their pharmacy? Yes.  (Agent: If no, request that the patient contact the pharmacy for the refill. If patient does not wish to contact the pharmacy document the reason why and proceed with request.) (Agent: If yes, when and what did the pharmacy advise?)  Preferred Pharmacy (with phone number or street name):  [facility], [address] AT [address]  [place] [address]  Phone: [phone] Fax: [phone]   Has the patient been seen for an appointment in the last year OR does the patient have an upcoming appointment? Yes.    Agent: Please be advised that RX refills may take up to 3 business days. We ask that you follow-up with your pharmacy.

## 2022-02-07 MED ORDER — LOSARTAN POTASSIUM-HCTZ 100-25 MG PO TABS: 1.0000 | ORAL_TABLET | Freq: Every day | ORAL | 0 refills | Status: DC

## 2022-02-07 NOTE — Telephone Encounter (Signed)
Requested Prescriptions  Pending Prescriptions Disp Refills  . losartan-hydrochlorothiazide (HYZAAR) 100-25 MG tablet 90 tablet 0    Sig: Take 1 tablet by mouth daily.     Cardiovascular: ARB + Diuretic Combos Passed - 02/06/2022  2:04 PM      Passed - K in normal range and within 180 days    Potassium  Date Value Ref Range Status  01/06/2022 3.8 3.5 - 5.1 mEq/L Final         Passed - Na in normal range and within 180 days    Sodium  Date Value Ref Range Status  01/06/2022 141 135 - 145 mEq/L Final  07/31/2021 136 134 - 144 mmol/L Final         Passed - Cr in normal range and within 180 days    Creatinine, Ser  Date Value Ref Range Status  01/06/2022 1.13 0.40 - 1.50 mg/dL Final   Creatinine,U  Date Value Ref Range Status  01/06/2022 152.4 mg/dL Final         Passed - eGFR is 10 or above and within 180 days    GFR calc Af Amer  Date Value Ref Range Status  08/16/2015 >60 >60 mL/min Final    Comment:    (NOTE) The eGFR has been calculated using the CKD EPI equation. This calculation has not been validated in all clinical situations. eGFR's persistently <60 mL/min signify possible Chronic Kidney Disease.    GFR, Estimated  Date Value Ref Range Status  08/24/2020 >60 >60 mL/min Final    Comment:    (NOTE) Calculated using the CKD-EPI Creatinine Equation (2021)    GFR  Date Value Ref Range Status  01/06/2022 71.53 >60.00 mL/min Final    Comment:    Calculated using the CKD-EPI Creatinine Equation (2021)   eGFR  Date Value Ref Range Status  07/31/2021 76 >59 mL/min/1.73 Final         Passed - Patient is not pregnant      Passed - Last BP in normal range    BP Readings from Last 1 Encounters:  01/06/22 132/88         Passed - Valid encounter within last 6 months    Recent Outpatient Visits          5 months ago Uncontrolled type 2 diabetes mellitus with hyperglycemia [facility])   [facility] at [facility], Amy J, NP   6 months ago Encounter  to establish care   [facility] at [facility], Flonnie Hailstone, NP

## 2022-02-22 ENCOUNTER — Other Ambulatory Visit: Payer: Self-pay | Admitting: Internal Medicine

## 2022-02-22 DIAGNOSIS — E119 Type 2 diabetes mellitus without complications: Secondary | ICD-10-CM

## 2022-03-15 IMAGING — DX DG WRIST COMPLETE 3+V*L*
4 series · 4 of 4 positions shown · non-contrast
Comparison: None.

CLINICAL DATA: Left wrist, base of left thumb pain. Chronic left
wrist pain after fall 7 months ago.

EXAM:
LEFT WRIST - COMPLETE 3+ VIEW

[wrist pa]
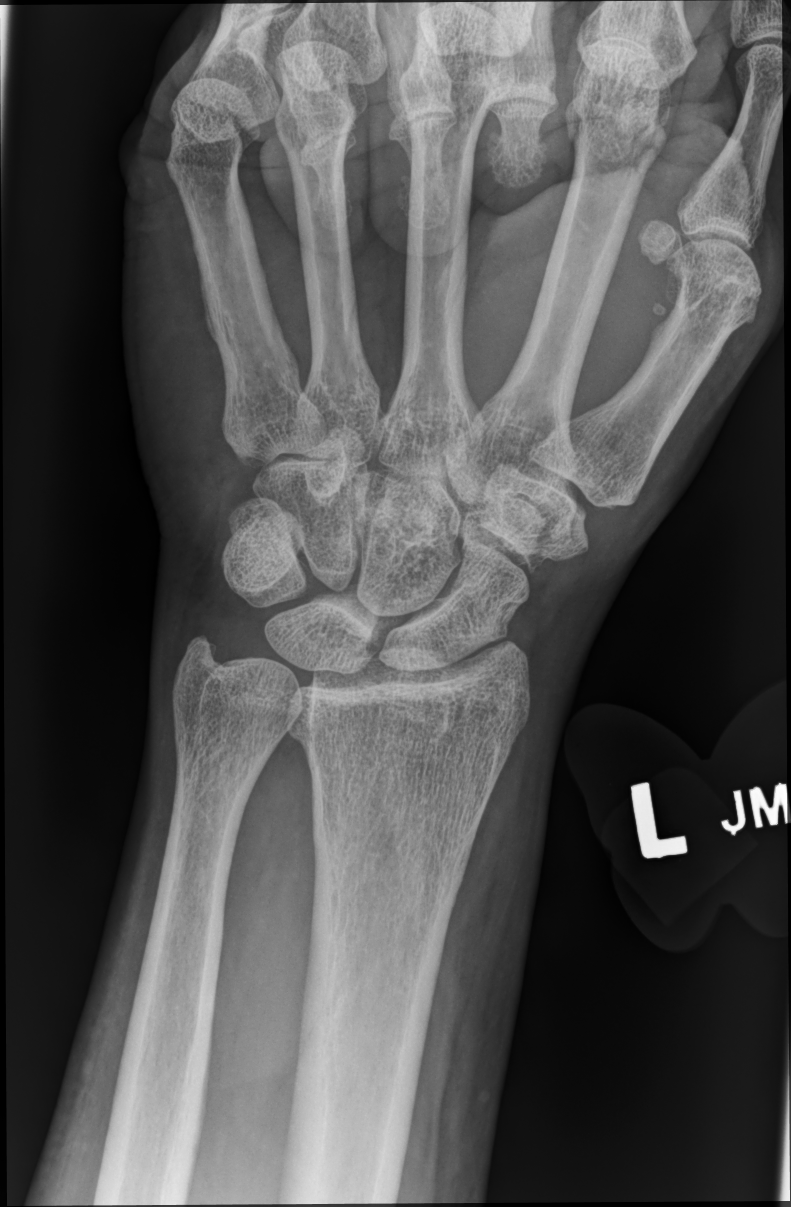

[wrist pa obl]
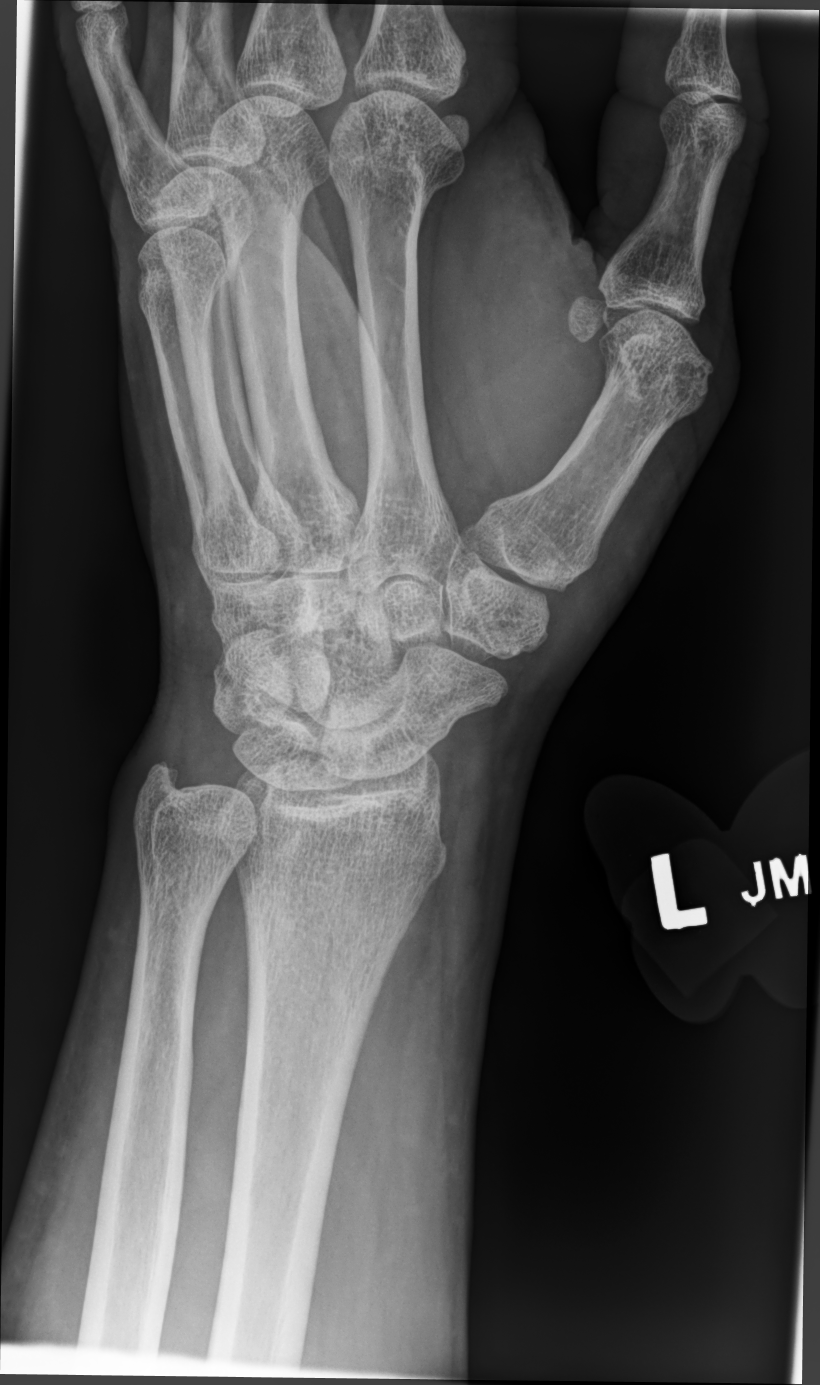

[wrist lat]
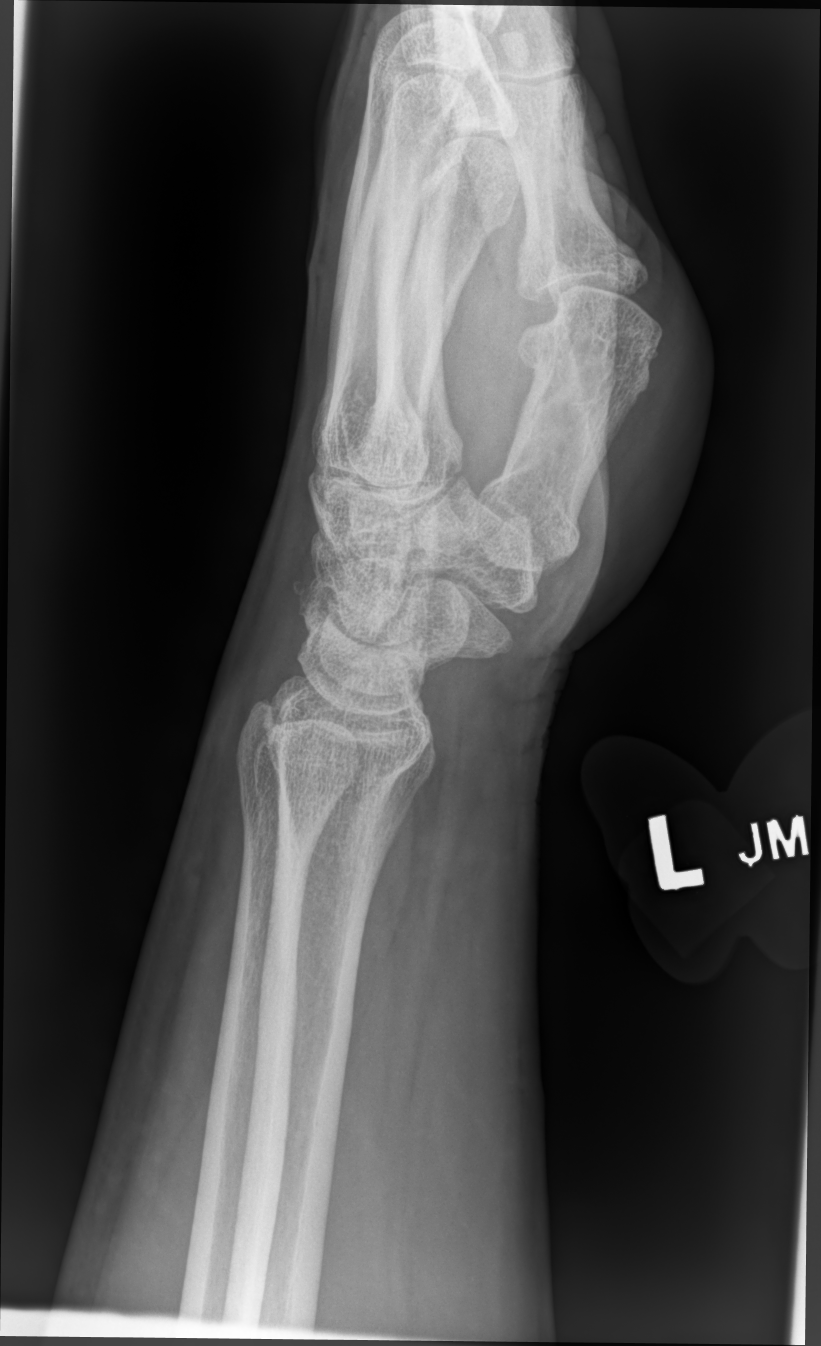

[scaphoid (os scaphoideum i) pa]
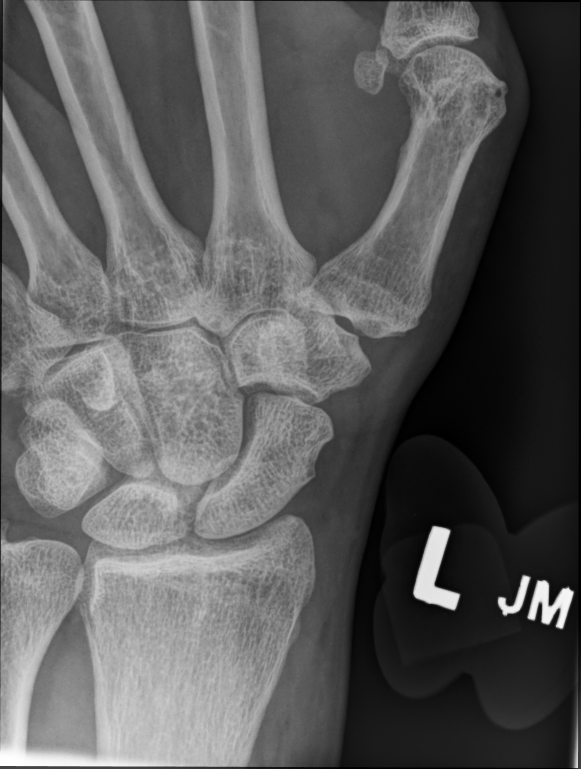

[4 of 4 positions shown; findings below may reference images not displayed]

FINDINGS: There is 2 mm ulnar positive variance. Mild triscaphe joint and
thumb carpometacarpal joint space narrowing and peripheral
osteophytosis degenerative change. Mild joint space narrowing and
peripheral osteophytosis thumb metacarpophalangeal joint
degenerative change. No acute fracture or dislocation.
IMPRESSION: Mild osteoarthritis within the triscaphe joint, thumb
carpometacarpal joint, and thumb metacarpophalangeal joint.

## 2022-03-19 ENCOUNTER — Other Ambulatory Visit (HOSPITAL_COMMUNITY): Payer: Self-pay

## 2022-03-20 ENCOUNTER — Telehealth: Payer: Self-pay | Admitting: Pharmacy Technician

## 2022-03-20 ENCOUNTER — Other Ambulatory Visit (HOSPITAL_COMMUNITY): Payer: Self-pay

## 2022-03-20 MED ORDER — BASAGLAR KWIKPEN 100 UNIT/ML ~~LOC~~ SOPN: 16.0000 [IU] | PEN_INJECTOR | Freq: Every day | SUBCUTANEOUS | 6 refills | Status: DC

## 2022-03-20 NOTE — Telephone Encounter (Signed)
Patient Advocate Encounter   Received notification that prior authorization for Levemir is required/requested.  Per Test Claim: Lee Greene is preferred by his ins.  Could he switch to this or was there a clinical reason for him switching from lantus, which Basaglar would not work for the same reason?

## 2022-05-12 ENCOUNTER — Other Ambulatory Visit: Payer: Self-pay | Admitting: Family

## 2022-05-12 DIAGNOSIS — I10 Essential (primary) hypertension: Secondary | ICD-10-CM

## 2022-06-08 ENCOUNTER — Other Ambulatory Visit: Payer: Self-pay | Admitting: Family

## 2022-06-08 DIAGNOSIS — I10 Essential (primary) hypertension: Secondary | ICD-10-CM

## 2022-07-05 ENCOUNTER — Other Ambulatory Visit: Payer: Self-pay | Admitting: Internal Medicine

## 2022-07-05 DIAGNOSIS — E119 Type 2 diabetes mellitus without complications: Secondary | ICD-10-CM

## 2022-07-09 NOTE — Progress Notes (Deleted)
Name: Lee Greene  Age/ Sex: 61 y.o., male   MRN/ DOB: [id number], 01/09/1961     PCP: Camillia Herter, NP   Reason for Endocrinology Evaluation: Type 2 Diabetes Mellitus     Initial Endocrinology Clinic Visit: 10/01/2018    PATIENT IDENTIFIER: Lee Greene is a 61 y.o. male with a past medical history of HTN and T2DM. The patient has followed with Endocrinology clinic since 10/01/2018 for consultative assistance with management of his diabetes.  DIABETIC HISTORY:  Lee Greene was diagnosed with T2DM in  2013. He was on oral glycemic agents since his diagnosis. His hemoglobin A1c has ranged from 6.7% in 2013, peaking at 17.4%  In 2020.  On his initial visit to our clinic his A1c was 17.4 %. He was on Metformin. He was started on Lantus in 09/2018 but stopped in 11/2018 Started Farxiga 11/2018 but was stopped in 2022 due to fatigue     He was started on Insulin 07/2021 with an A1c 13.5%   Started Rybelsus 09/2021  SUBJECTIVE:   During the last visit (01/06/2022): A1c 7.1%   Today (07/10/2022): Lee Greene is here for a  follow up on his diabetes management.  [place] Kitchen He checks his blood sugars multiple times daily,through CGM. The patient has not had hypoglycemic episodes since the last clinic visit.   Denies nausea, vomiting or diarrhea     HOME DIABETES REGIMEN:  Metformin 500 mg XR 2 Tabs BID Basaglar 16 units daily  Rybelsus 7 mg daily    CONTINUOUS GLUCOSE MONITORING RECORD INTERPRETATION    Dates of Recording: 6/13-6/26/2023  Sensor description: [employer]  Results statistics:   CGM use % of time 10%  Average and SD 139/22.2  Time in range  84   %  % Time Above 180 16  % Time above 250 0  % Time Below target 0      Glycemic patterns summary: Limited data with 10% use of CGM  Hyperglycemic episodes postprandial  Hypoglycemic episodes occurred N/A  Overnight periods: At goal     DIABETIC COMPLICATIONS: Microvascular  complications:   Denies: CKD< neuropathy, retinopathy Last Eye Exam: Completed 2022  Macrovascular complications:   Denies: CAD, CVA, PVD       HISTORY:  Past Medical History:  Past Medical History:  Diagnosis Date   Chest pain    no issues since 2014    Diabetes mellitus without complication ([facility])    diet controlled, no medicines    Hypertension    off BP medicines x 3 months as of 07-03-15   Past Surgical History:  Past Surgical History:  Procedure Laterality Date   HAND SURGERY     1990's   Social History:  reports that he has never smoked. He has never been exposed to tobacco smoke. He has never used smokeless tobacco. He reports that he does not drink alcohol and does not use drugs. Family History:  Family History  Problem Relation Age of Onset   Hypertension Father    Diabetes Father    Colon cancer Neg Hx    Colon polyps Neg Hx    Esophageal cancer Neg Hx    Rectal cancer Neg Hx    Stomach cancer Neg Hx      HOME MEDICATIONS: Allergies as of 07/10/2022   No Known Allergies      Medication List        Accurate as of July 10, 2022  7:01 AM.  If you have any questions, ask your nurse or doctor.          albuterol 108 (90 Base) MCG/ACT inhaler Commonly known as: VENTOLIN HFA Inhale 1-2 puffs into the lungs every 4 (four) hours as needed for wheezing or shortness of breath.   Basaglar KwikPen 100 UNIT/ML Inject 16 Units into the skin daily.   FreeStyle Libre 3 Sensor Misc USE AS DIRECTED   ibuprofen 600 MG tablet Commonly known as: ADVIL Take 1 tablet (600 mg total) by mouth every 6 (six) hours as needed.   Insulin Pen Needle 32G X 4 MM Misc 1 Device by Does not apply route daily in the afternoon.   losartan-hydrochlorothiazide 100-25 MG tablet Commonly known as: HYZAAR Take 1 tablet by mouth daily.   metFORMIN 500 MG 24 hr tablet Commonly known as: Glucophage XR Take 2 tablets (1,000 mg total) by mouth 2 (two) times daily with  a meal.   Rybelsus 7 MG Tabs Generic drug: Semaglutide Take 7 mg by mouth daily.        OBJECTIVE:    VITAL SIGNS:There were no vitals taken for this visit.  PHYSICAL EXAM:  General: Pt appears well and is in NAD  Neck: General: Supple without adenopathy or carotid bruits. Thyroid: Thyroid size normal.  No goiter or nodules appreciated. No thyroid bruit.  Lungs: Clear with good BS bilat with no rales, rhonchi, or wheezes  Heart: RRR with normal S1 and S2 and no gallops; no murmurs; no rub  Abdomen: Normoactive bowel sounds, soft, nontender, without masses or organomegaly palpable  Extremities:   Lower extremities - No pretibial edema. No lesions.  Skin: Normal texture and temperature to palpation. No rash noted.   Neuro: MS is good with appropriate affect, pt is alert and Ox3      DM foot exam: 01/06/2022  The skin of the feet is intact without sores or ulcerations. The pedal pulses are 2+ on right and 2+ on left. The sensation is intact to a screening 5.07, 10 gram monofilament bilaterally   DATA REVIEWED:  Lab Results  Component Value Date   HGBA1C 7.1 (A) 01/06/2022   HGBA1C 12.5 (A) 08/27/2021   HGBA1C 13.5 (A) 07/31/2021    Latest Reference Range & Units 01/06/22 08:48  Sodium 135 - 145 mEq/L 141  Potassium 3.5 - 5.1 mEq/L 3.8  Chloride 96 - 112 mEq/L 102  CO2 19 - 32 mEq/L 29  Glucose 70 - 99 mg/dL 122 (H)  BUN 6 - 23 mg/dL 15  Creatinine 0.40 - 1.50 mg/dL 1.13  Calcium 8.4 - 10.5 mg/dL 9.9  GFR >60.00 mL/min 71.53  Total CHOL/HDL Ratio  2  Cholesterol 0 - 200 mg/dL 146  HDL Cholesterol >39.00 mg/dL 70.80  LDL (calc) 0 - 99 mg/dL 43  MICROALB/CREAT RATIO 0.0 - 30.0 mg/g 0.5  NonHDL  74.75  Triglycerides 0.0 - 149.0 mg/dL 157.0 (H)  VLDL 0.0 - 40.0 mg/dL 31.4    Latest Reference Range & Units 01/06/22 08:48  Creatinine,U mg/dL 152.4  Microalb, Ur 0.0 - 1.9 mg/dL <0.7  MICROALB/CREAT RATIO 0.0 - 30.0 mg/g 0.5      ASSESSMENT / PLAN /  RECOMMENDATIONS:   1) Type 2 Diabetes Mellitus, OPtimally  controlled, Without complications - Most recent A1c of 7.1 %. Goal A1c < 7.0 %.   - Praised the pt on Improved glycemic control  - He was encouraged to use [employer], will upgrade to [facility] 3 -Patient advised to schedule  follow-up appointment with his PCP as he has not seen her in a while   MEDICATIONS: Continue Rybelsus  7 mg daily Continue Metformin 500 mg 2 tabs BID Continue Levemir 16 units daily  EDUCATION / INSTRUCTIONS: BG monitoring instructions: Patient is instructed to check his blood sugars 2 times a day, fasting and bedtime. Call [facility] if: BG persistently < 70  I reviewed the Rule of 15 for the treatment of hypoglycemia in detail with the patient. Literature supplied.   2) Hypertriglyceridemia:   -Triglycerides trending down.  LDL at goal -No indication for statin therapy at this time   F/U in 6 months    Signed electronically by: Mack Guise, MD  [facility]  [address] [place], [place] [address] Phone: [phone] FAX: [phone]   CC: Camillia Herter, NP [place] [address] Phone: [phone]  Fax: [phone]  Return to Endocrinology clinic as below: Future Appointments  Date Time Provider [place]  07/10/2022  8:30 AM Mennie Spiller, Melanie Crazier, MD [facility] None  08/01/2022 10:40 AM Midge Minium, MD [facility]

## 2022-07-10 ENCOUNTER — Ambulatory Visit: Payer: 59 | Admitting: Internal Medicine

## 2022-07-14 IMAGING — DX DG CHEST 2V
2 series · 2 of 2 positions shown · non-contrast
Comparison: Chest x-ray 07/26/2019.

CLINICAL DATA: Dyspnea and cough.

EXAM:
CHEST - 2 VIEW

[chest pa]
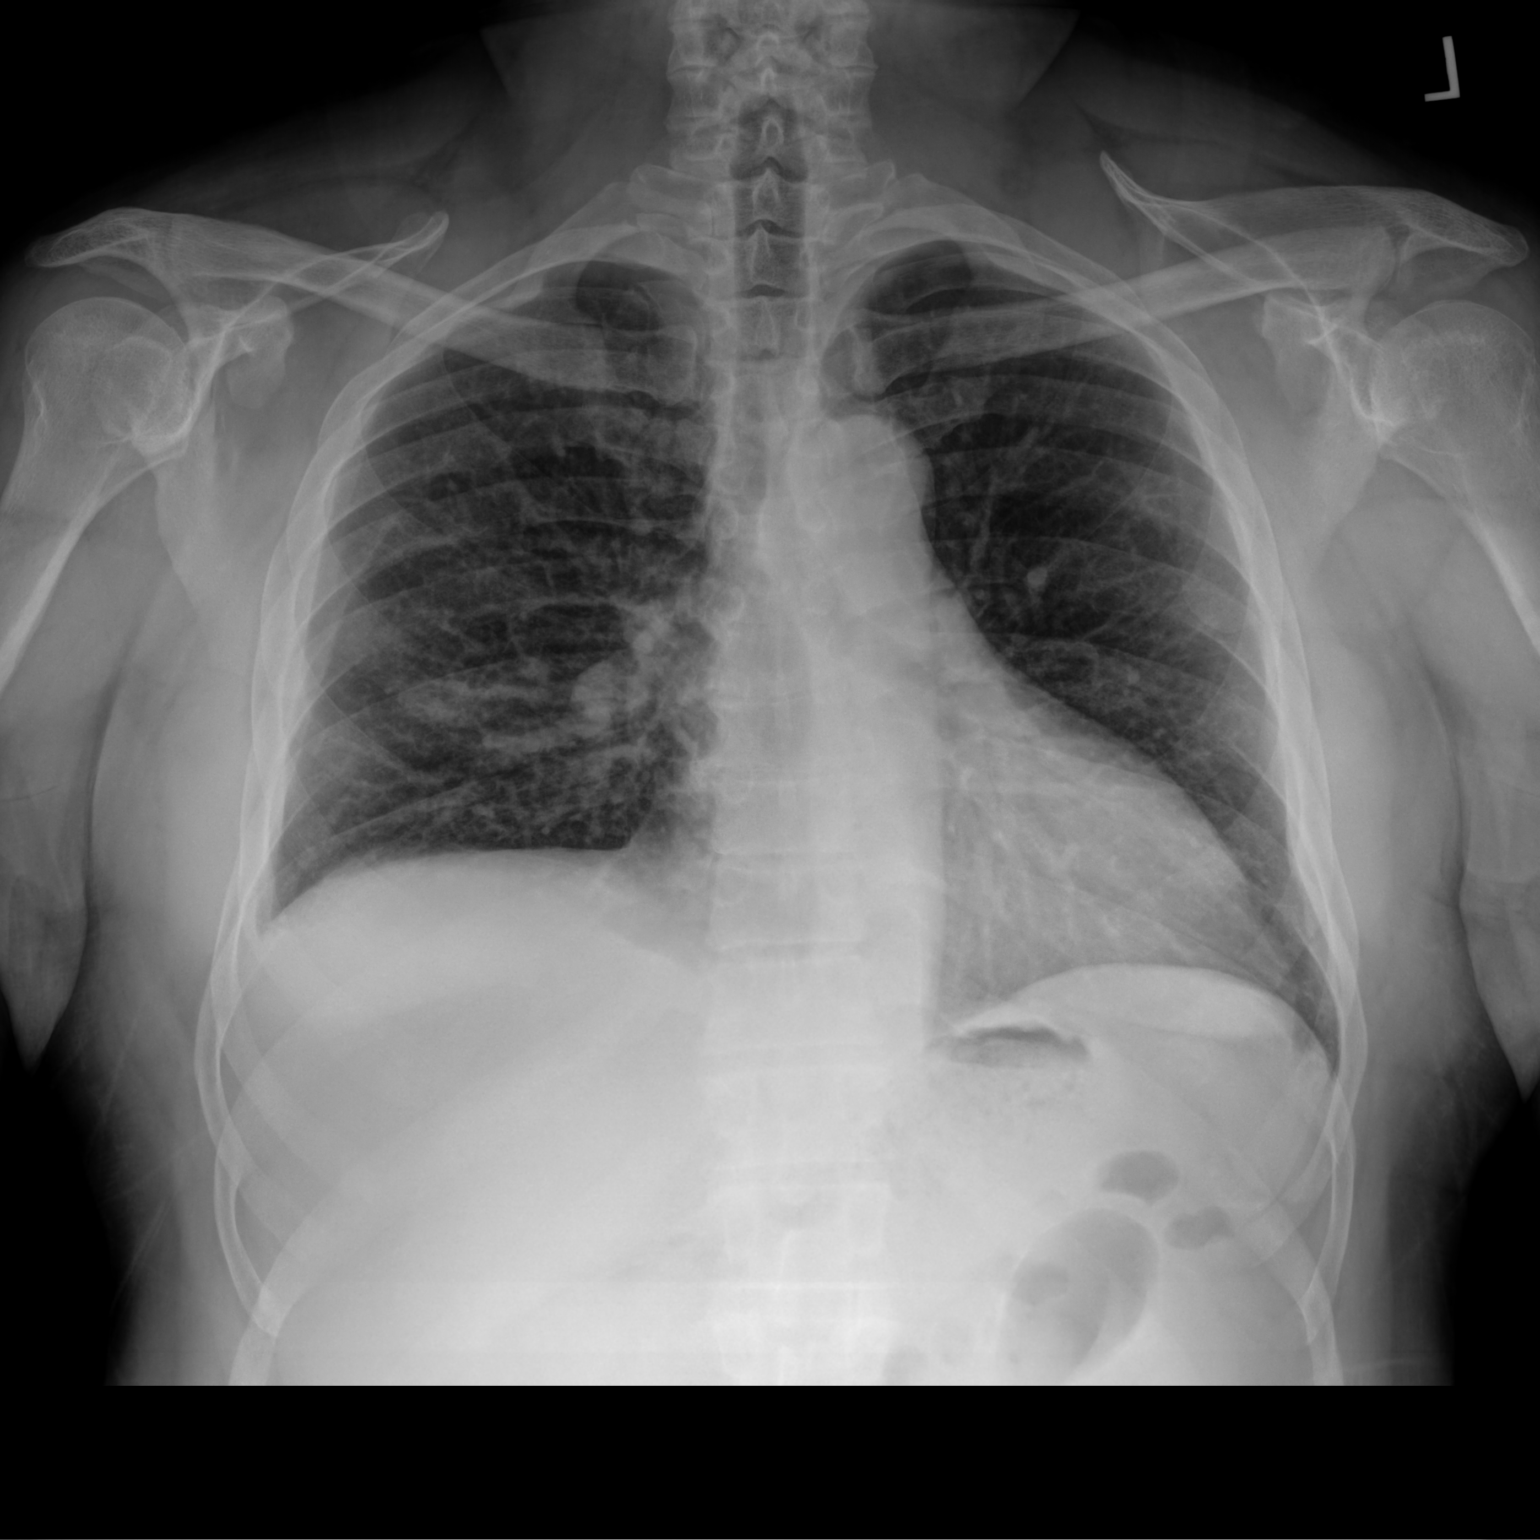

[chest lat]
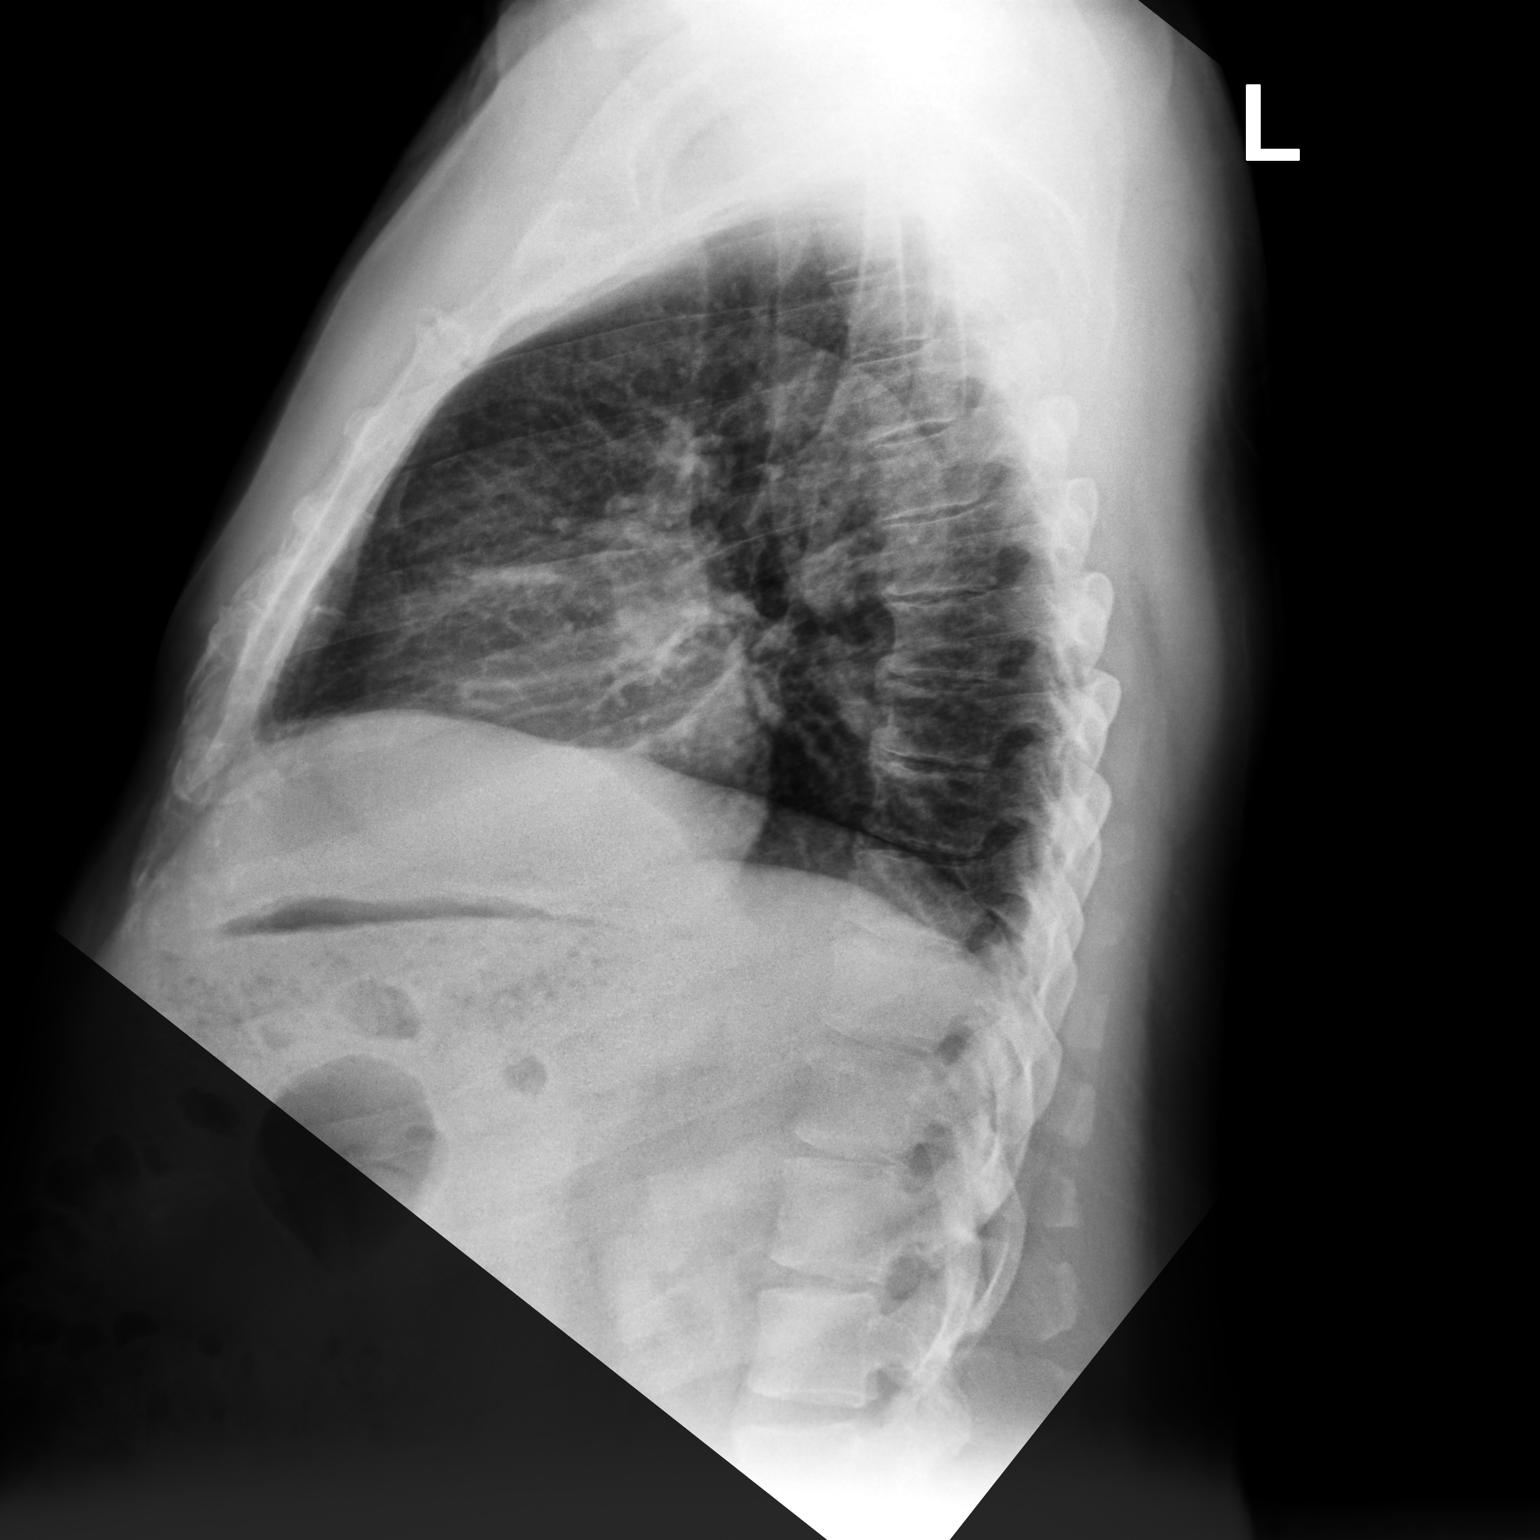

[2 of 2 positions shown; findings below may reference images not displayed]

FINDINGS: There is patchy airspace disease in the right mid lung/right upper
lobe. There is no pleural effusion or pneumothorax.
Cardiomediastinal silhouette is within normal limits. No acute
fractures are seen.
IMPRESSION: Minimal patchy right upper lobe airspace disease worrisome for
pneumonia. Followup PA and lateral chest X-ray is recommended in 4-6
weeks following trial of antibiotic therapy to ensure resolution and
exclude underlying pathology.

## 2022-08-01 ENCOUNTER — Other Ambulatory Visit: Payer: Self-pay | Admitting: Family

## 2022-08-01 ENCOUNTER — Encounter: Payer: Self-pay | Admitting: Family Medicine

## 2022-08-01 DIAGNOSIS — I10 Essential (primary) hypertension: Secondary | ICD-10-CM

## 2022-08-04 ENCOUNTER — Other Ambulatory Visit: Payer: Self-pay

## 2022-08-04 ENCOUNTER — Telehealth: Payer: Self-pay | Admitting: Family

## 2022-08-04 DIAGNOSIS — I10 Essential (primary) hypertension: Secondary | ICD-10-CM

## 2022-08-04 MED ORDER — LOSARTAN POTASSIUM-HCTZ 100-25 MG PO TABS: 1.0000 | ORAL_TABLET | Freq: Every day | ORAL | 0 refills | Status: DC

## 2022-08-04 NOTE — Telephone Encounter (Signed)
Pt informed I sent in 90 day

## 2022-08-04 NOTE — Telephone Encounter (Signed)
Encourage patient to contact the pharmacy for refills or they can request refills through [facility]  (Please schedule appointment if patient has not been seen in over a year)    [place] THIS SENT TO:  [facility] [place], [place] - [address] AT [place] (HYZAAR) 100-25 MG tablet   NOTES/COMMENTS FROM PATIENT: Patient was late for his physical 1/19//2. Reschedule his physical 08/14/22. Patient is calling to get a refill on his medication.      [place] office please notify patient: It takes 48-72 hours to process rx refill requests Ask patient to call pharmacy to ensure rx is ready before heading there.

## 2022-08-05 NOTE — Telephone Encounter (Signed)
Losartan-Hydrochlorothiazide 90 day supply ordered on 08/04/2022 by Annye Asa, MD.

## 2022-08-14 ENCOUNTER — Ambulatory Visit (INDEPENDENT_AMBULATORY_CARE_PROVIDER_SITE_OTHER): Payer: Commercial Managed Care - HMO | Admitting: Family Medicine

## 2022-08-14 ENCOUNTER — Encounter: Payer: Self-pay | Admitting: Family Medicine

## 2022-08-14 VITALS — BP 122/84 | HR 69 | Temp 97.1°F | Resp 17 | Ht 67.5 in | Wt 196.2 lb

## 2022-08-14 DIAGNOSIS — Z125 Encounter for screening for malignant neoplasm of prostate: Secondary | ICD-10-CM | POA: Diagnosis not present

## 2022-08-14 DIAGNOSIS — E119 Type 2 diabetes mellitus without complications: Secondary | ICD-10-CM | POA: Diagnosis not present

## 2022-08-14 DIAGNOSIS — Z794 Long term (current) use of insulin: Secondary | ICD-10-CM

## 2022-08-14 DIAGNOSIS — Z0000 Encounter for general adult medical examination without abnormal findings: Secondary | ICD-10-CM | POA: Diagnosis not present

## 2022-08-14 LAB — CBC WITH DIFFERENTIAL/PLATELET
Basophils Absolute: 0.1 10*3/uL (ref 0.0–0.1)
Basophils Relative: 0.6 % (ref 0.0–3.0)
Eosinophils Absolute: 0.1 10*3/uL (ref 0.0–0.7)
Eosinophils Relative: 1.4 % (ref 0.0–5.0)
HCT: 52.3 % — ABNORMAL HIGH (ref 39.0–52.0)
Hemoglobin: 17.1 g/dL — ABNORMAL HIGH (ref 13.0–17.0)
Lymphocytes Relative: 37.8 % (ref 12.0–46.0)
Lymphs Abs: 3.7 10*3/uL (ref 0.7–4.0)
MCHC: 32.7 g/dL (ref 30.0–36.0)
MCV: 79.6 fl (ref 78.0–100.0)
Monocytes Absolute: 0.9 10*3/uL (ref 0.1–1.0)
Monocytes Relative: 9.6 % (ref 3.0–12.0)
Neutro Abs: 4.9 10*3/uL (ref 1.4–7.7)
Neutrophils Relative %: 50.6 % (ref 43.0–77.0)
Platelets: 299 10*3/uL (ref 150.0–400.0)
RBC: 6.57 Mil/uL — ABNORMAL HIGH (ref 4.22–5.81)
RDW: 14.1 % (ref 11.5–15.5)
WBC: 9.8 10*3/uL (ref 4.0–10.5)

## 2022-08-14 LAB — BASIC METABOLIC PANEL
BUN: 18 mg/dL (ref 6–23)
CO2: 29 mEq/L (ref 19–32)
Calcium: 10.4 mg/dL (ref 8.4–10.5)
Chloride: 99 mEq/L (ref 96–112)
Creatinine, Ser: 1.14 mg/dL (ref 0.40–1.50)
GFR: 70.48 mL/min (ref >60.00)
Glucose, Bld: 109 mg/dL — ABNORMAL HIGH (ref 70–99)
Potassium: 4 mEq/L (ref 3.5–5.1)
Sodium: 140 mEq/L (ref 135–145)

## 2022-08-14 LAB — LIPID PANEL
Cholesterol: 145 mg/dL (ref 0–200)
HDL: 72.5 mg/dL (ref >39.00)
LDL Cholesterol: 41 mg/dL (ref 0–99)
NonHDL: 72.25
Total CHOL/HDL Ratio: 2
Triglycerides: 154 mg/dL — ABNORMAL HIGH (ref 0.0–149.0)
VLDL: 30.8 mg/dL (ref 0.0–40.0)

## 2022-08-14 LAB — HEPATIC FUNCTION PANEL
ALT: 28 U/L (ref 0–53)
AST: 17 U/L (ref 0–37)
Albumin: 4.8 g/dL (ref 3.5–5.2)
Alkaline Phosphatase: 74 U/L (ref 39–117)
Bilirubin, Direct: 0.2 mg/dL (ref 0.0–0.3)
Total Bilirubin: 1.1 mg/dL (ref 0.2–1.2)
Total Protein: 7.8 g/dL (ref 6.0–8.3)

## 2022-08-14 LAB — HEMOGLOBIN A1C: Hgb A1c MFr Bld: 6.8 % — ABNORMAL HIGH (ref 4.6–6.5)

## 2022-08-14 LAB — TSH: TSH: 1.42 u[IU]/mL (ref 0.35–5.50)

## 2022-08-14 LAB — PSA: PSA: 0.72 ng/mL (ref 0.10–4.00)

## 2022-08-14 NOTE — Assessment & Plan Note (Signed)
Pt's PE WNL.  UTD on Tdap, flu.  Due for repeat colonoscopy- pt to call [facility].  Check labs.  Anticipatory guidance provided.

## 2022-08-14 NOTE — Progress Notes (Signed)
   Subjective:    Patient ID: Lee Greene, male    DOB: [birth year], 62 y.o.   MRN: [id number]  HPI CPE- pt here today to re-establish.  Due for colonoscopy.  UTD on eye exam, foot exam, microalbumin.  Patient Care Team    Relationship Specialty Notifications Start End  Midge Minium, MD PCP - General Family Medicine  08/05/22   [facility]    08/14/22 08/14/22  [facility]    08/14/22       Health Maintenance  Topic Date Due   COLONOSCOPY (Pts 45-16yr Insurance coverage will need to be confirmed)  07/16/2020   HEMOGLOBIN A1C  07/08/2022   COVID-19 Vaccine (3 - 2023-24 season) 08/30/2022 (Originally 03/14/2022)   Zoster Vaccines- Shingrix (1 of 2) 11/12/2022 (Originally 04/15/2013)   Diabetic kidney evaluation - eGFR measurement  01/07/2023   Diabetic kidney evaluation - Urine ACR  01/07/2023   FOOT EXAM  01/07/2023   OPHTHALMOLOGY EXAM  05/02/2023   DTaP/Tdap/Td (2 - Td or Tdap) 11/02/2030   INFLUENZA VACCINE  Completed   HPV VACCINES  Aged Out   Hepatitis C Screening  Discontinued   HIV Screening  Discontinued      Review of Systems Patient reports no vision/hearing changes, anorexia, fever, adenopathy, persistant/recurrent hoarseness, swallowing issues, chest pain, palpitations, edema, persistant/recurrent cough, hemoptysis, dyspnea (rest,exertional, paroxysmal nocturnal), gastrointestinal  bleeding (melena, rectal bleeding), abdominal pain, excessive heart burn, GU symptoms (dysuria, hematuria, voiding/incontinence issues) syncope, focal weakness, memory loss, numbness & tingling, skin/hair/nail changes, depression, anxiety, abnormal bruising/bleeding, musculoskeletal symptoms/signs.   + 10 lb weight loss since June    Objective:   Physical Exam General Appearance:    Alert, cooperative, no distress, appears stated age  Head:    Normocephalic, without obvious abnormality, atraumatic  Eyes:    PERRL, conjunctiva/corneas clear, EOM's intact both eyes       Ears:     Normal TM's and external ear canals, both ears  Nose:   Nares normal, septum midline, mucosa normal, no drainage   or sinus tenderness  Throat:   Lips, mucosa, and tongue normal; teeth and gums normal  Neck:   Supple, symmetrical, trachea midline, no adenopathy;       thyroid:  No enlargement/tenderness/nodules  Back:     Symmetric, no curvature, ROM normal, no CVA tenderness  Lungs:     Clear to auscultation bilaterally, respirations unlabored  Chest wall:    No tenderness or deformity  Heart:    Regular rate and rhythm, S1 and S2 normal, no murmur, rub   or gallop  Abdomen:     Soft, non-tender, bowel sounds active all four quadrants,    no masses, no organomegaly  Genitalia:    Deferred   Rectal:    Extremities:   Extremities normal, atraumatic, no cyanosis or edema  Pulses:   2+ and symmetric all extremities  Skin:   Skin color, texture, turgor normal, no rashes or lesions  Lymph nodes:   Cervical, supraclavicular, and axillary nodes normal  Neurologic:   CNII-XII intact. Normal strength, sensation and reflexes      throughout          Assessment & Plan:

## 2022-08-14 NOTE — Assessment & Plan Note (Signed)
Chronic problem.  Follows w/ Endo.  UTD on eye exam, foot exam, microalbumin.  Check labs.  Adjust meds prn

## 2022-08-25 ENCOUNTER — Encounter: Payer: Self-pay | Admitting: Gastroenterology

## 2022-09-11 ENCOUNTER — Encounter: Payer: Self-pay | Admitting: Gastroenterology

## 2022-09-11 ENCOUNTER — Telehealth: Payer: Self-pay

## 2022-09-11 ENCOUNTER — Ambulatory Visit (AMBULATORY_SURGERY_CENTER): Payer: Commercial Managed Care - HMO

## 2022-09-11 ENCOUNTER — Other Ambulatory Visit: Payer: Self-pay

## 2022-09-11 VITALS — Ht 68.0 in | Wt 196.0 lb

## 2022-09-11 DIAGNOSIS — Z86010 Personal history of colonic polyps: Secondary | ICD-10-CM

## 2022-09-11 MED ORDER — NA SULFATE-K SULFATE-MG SULF 17.5-3.13-1.6 GM/177ML PO SOLN: 1.0000 | Freq: Once | ORAL | 0 refills | Status: DC

## 2022-09-11 MED ORDER — NA SULFATE-K SULFATE-MG SULF 17.5-3.13-1.6 GM/177ML PO SOLN: 1.0000 | Freq: Once | ORAL | 0 refills | Status: AC

## 2022-09-11 NOTE — Progress Notes (Signed)

## 2022-09-11 NOTE — Telephone Encounter (Signed)
Inbound call from [facility] requesting to speak to nurse. Phone number is  [phone]. Ask for Merrilee Seashore

## 2022-09-11 NOTE — Telephone Encounter (Signed)
Unable to route rx to pharmacy with multiple attempts prescription taken over the phone by the pharmacist at the [facility] on [address]

## 2022-09-11 NOTE — Telephone Encounter (Signed)
Phone call returned

## 2022-10-01 ENCOUNTER — Encounter: Payer: Self-pay | Admitting: Certified Registered Nurse Anesthetist

## 2022-10-02 ENCOUNTER — Encounter: Payer: Self-pay | Admitting: Gastroenterology

## 2022-10-02 ENCOUNTER — Ambulatory Visit (AMBULATORY_SURGERY_CENTER): Payer: BC Managed Care – PPO | Admitting: Gastroenterology

## 2022-10-02 VITALS — BP 125/85 | HR 78 | Temp 99.0°F | Resp 30 | Ht 65.0 in | Wt 196.0 lb

## 2022-10-02 DIAGNOSIS — Z86010 Personal history of colonic polyps: Secondary | ICD-10-CM | POA: Diagnosis not present

## 2022-10-02 DIAGNOSIS — Z09 Encounter for follow-up examination after completed treatment for conditions other than malignant neoplasm: Secondary | ICD-10-CM

## 2022-10-02 DIAGNOSIS — D122 Benign neoplasm of ascending colon: Secondary | ICD-10-CM

## 2022-10-02 DIAGNOSIS — Z1211 Encounter for screening for malignant neoplasm of colon: Secondary | ICD-10-CM | POA: Diagnosis not present

## 2022-10-02 MED ORDER — SODIUM CHLORIDE 0.9 % IV SOLN: 500.0000 mL | Freq: Once | INTRAVENOUS | Status: DC

## 2022-10-02 NOTE — Progress Notes (Signed)
Data will not transfer from monitor, vs being posted manually.   

## 2022-10-02 NOTE — Op Note (Signed)
[facility] Patient Name: Lee Greene [facility] Procedure Date: 10/02/2022 10:41 AM MRN: [id number] Endoscopist: Mallie Mussel L. Loletha Carrow , MD, [id number] Age: 62 Referring MD:  Date of Birth: [birth year] Gender: Male Account #: [id number] Procedure:                Colonoscopy Indications:              Surveillance: Personal history of adenomatous                            polyps on last colonoscopy > 5 years ago                           Two subcentimeter tubular adenomas November 2017                            (Dr. Ardis Hughs) Medicines:                Monitored Anesthesia Care Procedure:                Pre-Anesthesia Assessment:                           - Prior to the procedure, a History and Physical                            was performed, and patient medications and                            allergies were reviewed. The patient's tolerance of                            previous anesthesia was also reviewed. The risks                            and benefits of the procedure and the sedation                            options and risks were discussed with the patient.                            All questions were answered, and informed consent                            was obtained. Prior Anticoagulants: The patient has                            taken no anticoagulant or antiplatelet agents. ASA                            Grade Assessment: II - A patient with mild systemic                            disease. After reviewing the risks and benefits,  the patient was deemed in satisfactory condition to                            undergo the procedure.                           After obtaining informed consent, the colonoscope                            was passed under direct vision. Throughout the                            procedure, the patient's blood pressure, pulse, and                            oxygen saturations were monitored continuously. The                             Olympus CF-HQ190L [id number]) Colonoscope was                            introduced through the anus and advanced to the the                            cecum, identified by appendiceal orifice and                            ileocecal valve. The colonoscopy was extremely                            difficult due to restricted mobility of the colon,                            significant looping and the patient's body habitus.                            Successful completion of the procedure was aided by                            changing the patient's position, using manual                            pressure and straightening and shortening the scope                            to obtain bowel loop reduction. The patient                            tolerated the procedure well. The quality of the                            bowel preparation was excellent. The ileocecal  valve, appendiceal orifice, and rectum were                            photographed. Scope In: 10:53:58 AM Scope Out: 11:25:37 AM Scope Withdrawal Time: 0 hours 10 minutes 32 seconds  Total Procedure Duration: 0 hours 31 minutes 39 seconds  Findings:                 The perianal and digital rectal examinations were                            normal.                           An adenomatous appearing polyp was found in the                            appendiceal orifice. The polyp was sessile. In                            order to better visualize the polyp, suction was                            applied to it and it was partially dragged toward                            the scope causing partial inversion of the                            appendix. This revealed a sessile and broad base,                            briefly visualized before the polyp retracted                            partially back into the appendix. It was very                            difficult to maintain  scope position in this area                            due to the anatomic challenges of a redundant colon                            noted above.                           A few diverticula were found in the right colon.                           A diminutive polyp was found in the ascending                            colon. The polyp was sessile. The polyp was removed  with a cold snare. Resection and retrieval were                            complete.                           The sigmoid colon and descending colon were                            significantly redundant.                           The exam was otherwise without abnormality on                            direct and retroflexion views. Complications:            No immediate complications. Estimated Blood Loss:     Estimated blood loss was minimal. Impression:               - One polyp at the appendiceal orifice.                           - Diverticulosis in the right colon.                           - One diminutive polyp in the ascending colon,                            removed with a cold snare. Resected and retrieved.                           - Redundant colon.                           - The examination was otherwise normal on direct                            and retroflexion views.                           - The GI Genius (intelligent endoscopy module),                            computer-aided polyp detection system powered by AI                            was utilized to detect colorectal polyps through                            enhanced visualization during colonoscopy. Recommendation:           - Patient has a contact number available for                            emergencies. The signs and symptoms of potential  delayed complications were discussed with the                            patient. Return to normal activities tomorrow.                             Written discharge instructions were provided to the                            patient.                           - Resume previous diet.                           - Continue present medications.                           - Await pathology results.                           - Repeat colonoscopy is recommended for                            surveillance. The colonoscopy date will be                            determined after pathology results from today's                            exam become available for review.                           - Refer to a surgeon at appointment to be scheduled                            to discuss an extended appendectomy to remove the                            polyp. Lanelle Lindo L. Loletha Carrow, MD 10/02/2022 11:33:56 AM This report has been signed electronically.

## 2022-10-02 NOTE — Progress Notes (Signed)
Report given to PACU, vss 

## 2022-10-02 NOTE — Progress Notes (Signed)
History and Physical:  This patient presents for endoscopic testing for: Encounter Diagnosis  Name Primary?   Personal history of colonic polyps Yes    TA < 67mm x 2 Nov 217 Ardis Hughs) Patient denies chronic abdominal pain, rectal bleeding, constipation or diarrhea.   Patient is otherwise without complaints or active issues today.   Past Medical History: Past Medical History:  Diagnosis Date   Chest pain    no issues since 2014    Diabetes mellitus without complication (HCC)    diet controlled, no medicines    Hypertension    off BP medicines x 3 months as of 07-03-15     Past Surgical History: Past Surgical History:  Procedure Laterality Date   COLONOSCOPY     HAND SURGERY     1990's    Allergies: No Known Allergies  Outpatient Meds: Current Outpatient Medications  Medication Sig Dispense Refill   Continuous Blood Gluc Sensor (FREESTYLE LIBRE 3 SENSOR) MISC USE AS DIRECTED 2 each 3   Insulin Glargine (BASAGLAR KWIKPEN) 100 UNIT/ML Inject 16 Units into the skin daily. 15 mL 6   Insulin Pen Needle 32G X 4 MM MISC 1 Device by Does not apply route daily in the afternoon. 100 each 3   losartan-hydrochlorothiazide (HYZAAR) 100-25 MG tablet Take 1 tablet by mouth daily. 90 tablet 0   metFORMIN (GLUCOPHAGE XR) 500 MG 24 hr tablet Take 2 tablets (1,000 mg total) by mouth 2 (two) times daily with a meal. 360 tablet 3   Current Facility-Administered Medications  Medication Dose Route Frequency Provider Last Rate Last Admin   0.9 %  sodium chloride infusion  500 mL Intravenous Once Doran Stabler, MD          ___________________________________________________________________ Objective   Exam:  BP 128/84   Pulse 78   Temp 99 F (37.2 C)   Ht 5\' 5"  (1.651 m)   Wt 196 lb (88.9 kg)   SpO2 97%   BMI 32.62 kg/m   CV: regular , S1/S2 Resp: clear to auscultation bilaterally, normal RR and effort noted GI: soft, no tenderness, with active bowel  sounds.   Assessment: Encounter Diagnosis  Name Primary?   Personal history of colonic polyps Yes     Plan: Colonoscopy  The benefits and risks of the planned procedure were described in detail with the patient or (when appropriate) their health care proxy.  Risks were outlined as including, but not limited to, bleeding, infection, perforation, adverse medication reaction leading to cardiac or pulmonary decompensation, pancreatitis (if ERCP).  The limitation of incomplete mucosal visualization was also discussed.  No guarantees or warranties were given.    The patient is appropriate for an endoscopic procedure in the ambulatory setting.   - Wilfrid Lund, MD

## 2022-10-02 NOTE — Patient Instructions (Addendum)
-  Handout on polyps, diverticulosis provided -await pathology results -repeat colonoscopy for surveillance recommended. Date to be determined when pathology result become available   -Continue present medications  -Refer to a surgeon at appointment to be scheduled to discuss an extended appendectomy to remove the polyp. Office will call you for more information   YOU HAD AN ENDOSCOPIC PROCEDURE TODAY AT [facility]:   Refer to the procedure report that was given to you for any specific questions about what was found during the examination.  If the procedure report does not answer your questions, please call your gastroenterologist to clarify.  If you requested that your care partner not be given the details of your procedure findings, then the procedure report has been included in a sealed envelope for you to review at your convenience later.  YOU SHOULD EXPECT: Some feelings of bloating in the abdomen. Passage of more gas than usual.  Walking can help get rid of the air that was put into your GI tract during the procedure and reduce the bloating. If you had a lower endoscopy (such as a colonoscopy or flexible sigmoidoscopy) you may notice spotting of blood in your stool or on the toilet paper. If you underwent a bowel prep for your procedure, you may not have a normal bowel movement for a few days.  Please Note:  You might notice some irritation and congestion in your nose or some drainage.  This is from the oxygen used during your procedure.  There is no need for concern and it should clear up in a day or so.  SYMPTOMS TO REPORT IMMEDIATELY:  Following lower endoscopy (colonoscopy or flexible sigmoidoscopy):  Excessive amounts of blood in the stool  Significant tenderness or worsening of abdominal pains  Swelling of the abdomen that is new, acute  Fever of 100F or higher  For urgent or emergent issues, a gastroenterologist can be reached at any hour by calling [phone]  [phone]. Do not use MyChart messaging for urgent concerns.    DIET:  We do recommend a small meal at first, but then you may proceed to your regular diet.  Drink plenty of fluids but you should avoid alcoholic beverages for 24 hours.  ACTIVITY:  You should plan to take it easy for the rest of today and you should NOT DRIVE or use heavy machinery until tomorrow (because of the sedation medicines used during the test).    FOLLOW UP: Our staff will call the number listed on your records the next business day following your procedure.  We will call around 7:15- 8:00 am to check on you and address any questions or concerns that you may have regarding the information given to you following your procedure. If we do not reach you, we will leave a message.     If any biopsies were taken you will be contacted by phone or by letter within the next 1-3 weeks.  Please call us at [phone] if you have not heard about the biopsies in 3 weeks.    SIGNATURES/CONFIDENTIALITY: You and/or your care partner have signed paperwork which will be entered into your electronic medical record.  These signatures attest to the fact that that the information above on your After Visit Summary has been reviewed and is understood.  Full responsibility of the confidentiality of this discharge information lies with you and/or your care-partner.

## 2022-10-02 NOTE — Progress Notes (Signed)
Called to room to assist during endoscopic procedure.  Patient ID and intended procedure confirmed with present staff. Received instructions for my participation in the procedure from the performing physician.  

## 2022-10-03 ENCOUNTER — Telehealth: Payer: Self-pay

## 2022-10-03 NOTE — Telephone Encounter (Signed)
No answer, left message to call if having any issues or concerns, B.Gavon Majano RN 

## 2022-10-03 NOTE — Telephone Encounter (Signed)
Per the procedure report on 09-03-2022. Referral to [facility] for eval for appendectomy.  Records have been faxed, will await appointment info.

## 2022-10-08 NOTE — Telephone Encounter (Signed)
Office visit is scheduled 10-24-2022

## 2022-10-09 ENCOUNTER — Encounter: Payer: Self-pay | Admitting: Gastroenterology

## 2022-10-09 NOTE — Progress Notes (Signed)
Referral to [facility] sent by Magdalene River, appointment scheduled for 10/24/2022

## 2022-10-24 ENCOUNTER — Ambulatory Visit: Payer: Self-pay | Admitting: Surgery

## 2022-10-24 DIAGNOSIS — D122 Benign neoplasm of ascending colon: Secondary | ICD-10-CM | POA: Diagnosis not present

## 2022-10-24 NOTE — H&P (Signed)
History of Present Illness: Lee Greene is a 62 y.o. male who was referred to me for evaluation of an appendiceal orifice polyp. He has a history of tubular adenoma on colonoscopy in 2017, and recently had a follow up colonoscopy by Dr. Myrtie Neither on 3/21. This showed a tubular adenoma in the ascending colon which was resected (no malignancy or dysplasia), as well as a polyp in the appendiceal orifice which also appeared to be adenomatous. He was referred for appendectomy.   He has not had any prior abdominal surgeries. He does not take any blood thinners and has no known cardiopulmonary disease. He has no known family history of colon cancer.     Review of Systems: A complete review of systems was obtained from the patient.  I have reviewed this information and discussed as appropriate with the patient.  See HPI as well for other ROS.       Medical History: Past Medical History History reviewed. No pertinent past medical history.    There is no problem list on file for this patient.     Past Surgical History Past Surgical History: Procedure Laterality Date  right hand surgery   2001      Allergies No Known Allergies    Current Outpatient Medications on File Prior to Visit Medication Sig Dispense Refill  losartan-hydroCHLOROthiazide (HYZAAR) 100-25 mg tablet Take 1 tablet by mouth once daily      metFORMIN (GLUCOPHAGE-XR) 500 MG XR tablet Take by mouth       No current facility-administered medications on file prior to visit.     Family History Family History Problem Relation Age of Onset  High blood pressure (Hypertension) Mother    Diabetes Father    High blood pressure (Hypertension) Father        Social History   Tobacco Use Smoking Status Never Smokeless Tobacco Never     Social History Social History    Socioeconomic History  Marital status: Married Tobacco Use  Smoking status: Never  Smokeless tobacco: Never Substance and Sexual  Activity  Alcohol use: Never  Drug use: Never      Objective:     Vitals:   10/24/22 0849 BP: 132/88 Pulse: 86 Temp: 36.6 C (97.8 F) SpO2: 98% Weight: 91.4 kg (201 lb 9.6 oz) Height: 170.2 cm (5\' 7" )   Body mass index is 31.58 kg/m.   Physical Exam Vitals reviewed.  Constitutional:      General: He is not in acute distress.    Appearance: Normal appearance.  HENT:     Head: Normocephalic and atraumatic.  Eyes:     General: No scleral icterus.    Conjunctiva/sclera: Conjunctivae normal.  Cardiovascular:     Rate and Rhythm: Normal rate and regular rhythm.  Pulmonary:     Effort: Pulmonary effort is normal. No respiratory distress.     Breath sounds: Normal breath sounds. No wheezing.  Abdominal:     General: There is no distension.     Palpations: Abdomen is soft.     Tenderness: There is no abdominal tenderness.     Comments: No surgical scars.  Musculoskeletal:        General: Normal range of motion.  Skin:    General: Skin is warm and dry.     Coloration: Skin is not jaundiced.  Neurological:     General: No focal deficit present.     Mental Status: He is alert and oriented to person, place, and time.  Psychiatric:  Mood and Affect: Mood normal.        Behavior: Behavior normal.            Assessment and Plan: Diagnoses and all orders for this visit:   Adenomatous polyp of ascending colon -     [facility] Case Posting Request; Future   Other orders -     polyethylene glycol (MIRALAX) powder; Take 233.75 g by mouth once for 1 dose Take according to your procedure prep instructions. -     bisacodyL (DULCOLAX) 5 mg EC tablet; Take 4 tablets (20 mg total) by mouth once for 1 dose Take according to your pre-surgery bowel prep instructions. -     metroNIDAZOLE (FLAGYL) 500 MG tablet; Take 1 tablet (500 mg total) by mouth 3 (three) times daily for 3 doses Take according to your procedure colon prep instructions -     neomycin 500 mg tablet; Take 2  tablets (1,000 mg total) by mouth 3 (three) times daily for 3 doses Take according to your procedure colon prep instructions     This is a 62 yo male with a polyp of the appendiceal orifice. I personally reviewed his labs, referral notes and endoscopy images. This appears amenable to resection via an appendectomy with a small button of cecum, based on endoscopy findings. I reviewed laparoscopic appendectomy with the patient, as well as a possible ileocecectomy in the event the polyp cannot be completely resected with an appendectomy alone, although I think this is unlikely. Patient will need a bowel prep prior to surgery and was provided with prescriptions and written instructions today. All questions were answered and anticipated postoperative recovery and restrictions were reviewed.  Sophronia Simas, MD [facility] General, Hepatobiliary and Pancreatic Surgery 10/24/22 10:14 AM

## 2022-10-24 NOTE — H&P (View-Only) (Signed)
History of Present Illness: Lee Greene is a 62 y.o. male who was referred to me for evaluation of an appendiceal orifice polyp. He has a history of tubular adenoma on colonoscopy in 2017, and recently had a follow up colonoscopy by Dr. Danis on 3/21. This showed a tubular adenoma in the ascending colon which was resected (no malignancy or dysplasia), as well as a polyp in the appendiceal orifice which also appeared to be adenomatous. He was referred for appendectomy.   He has not had any prior abdominal surgeries. He does not take any blood thinners and has no known cardiopulmonary disease. He has no known family history of colon cancer.     Review of Systems: A complete review of systems was obtained from the patient.  I have reviewed this information and discussed as appropriate with the patient.  See HPI as well for other ROS.       Medical History: Past Medical History History reviewed. No pertinent past medical history.    There is no problem list on file for this patient.     Past Surgical History Past Surgical History: Procedure Laterality Date  right hand surgery   2001      Allergies No Known Allergies    Current Outpatient Medications on File Prior to Visit Medication Sig Dispense Refill  losartan-hydroCHLOROthiazide (HYZAAR) 100-25 mg tablet Take 1 tablet by mouth once daily      metFORMIN (GLUCOPHAGE-XR) 500 MG XR tablet Take by mouth       No current facility-administered medications on file prior to visit.     Family History Family History Problem Relation Age of Onset  High blood pressure (Hypertension) Mother    Diabetes Father    High blood pressure (Hypertension) Father        Social History   Tobacco Use Smoking Status Never Smokeless Tobacco Never     Social History Social History    Socioeconomic History  Marital status: Married Tobacco Use  Smoking status: Never  Smokeless tobacco: Never Substance and Sexual  Activity  Alcohol use: Never  Drug use: Never      Objective:     Vitals:   10/24/22 0849 BP: 132/88 Pulse: 86 Temp: 36.6 C (97.8 F) SpO2: 98% Weight: 91.4 kg (201 lb 9.6 oz) Height: 170.2 cm (5' 7")   Body mass index is 31.58 kg/m.   Physical Exam Vitals reviewed.  Constitutional:      General: He is not in acute distress.    Appearance: Normal appearance.  HENT:     Head: Normocephalic and atraumatic.  Eyes:     General: No scleral icterus.    Conjunctiva/sclera: Conjunctivae normal.  Cardiovascular:     Rate and Rhythm: Normal rate and regular rhythm.  Pulmonary:     Effort: Pulmonary effort is normal. No respiratory distress.     Breath sounds: Normal breath sounds. No wheezing.  Abdominal:     General: There is no distension.     Palpations: Abdomen is soft.     Tenderness: There is no abdominal tenderness.     Comments: No surgical scars.  Musculoskeletal:        General: Normal range of motion.  Skin:    General: Skin is warm and dry.     Coloration: Skin is not jaundiced.  Neurological:     General: No focal deficit present.     Mental Status: He is alert and oriented to person, place, and time.  Psychiatric:          Mood and Affect: Mood normal.        Behavior: Behavior normal.            Assessment and Plan: Diagnoses and all orders for this visit:   Adenomatous polyp of ascending colon -     CCS Case Posting Request; Future   Other orders -     polyethylene glycol (MIRALAX) powder; Take 233.75 g by mouth once for 1 dose Take according to your procedure prep instructions. -     bisacodyL (DULCOLAX) 5 mg EC tablet; Take 4 tablets (20 mg total) by mouth once for 1 dose Take according to your pre-surgery bowel prep instructions. -     metroNIDAZOLE (FLAGYL) 500 MG tablet; Take 1 tablet (500 mg total) by mouth 3 (three) times daily for 3 doses Take according to your procedure colon prep instructions -     neomycin 500 mg tablet; Take 2  tablets (1,000 mg total) by mouth 3 (three) times daily for 3 doses Take according to your procedure colon prep instructions     This is a 62 yo male with a polyp of the appendiceal orifice. I personally reviewed his labs, referral notes and endoscopy images. This appears amenable to resection via an appendectomy with a small button of cecum, based on endoscopy findings. I reviewed laparoscopic appendectomy with the patient, as well as a possible ileocecectomy in the event the polyp cannot be completely resected with an appendectomy alone, although I think this is unlikely. Patient will need a bowel prep prior to surgery and was provided with prescriptions and written instructions today. All questions were answered and anticipated postoperative recovery and restrictions were reviewed.  Ventura Leggitt, MD [facility], Hepatobiliary and Pancreatic Surgery 10/24/22 10:14 AM   

## 2022-11-13 NOTE — Patient Instructions (Signed)
DUE TO COVID-19 ONLY TWO VISITORS  (aged 60 and older)  ARE ALLOWED TO COME WITH YOU AND STAY IN THE WAITING ROOM ONLY DURING PRE OP AND PROCEDURE.   **NO VISITORS ARE ALLOWED IN THE SHORT STAY AREA OR RECOVERY ROOM!!**  IF YOU WILL BE ADMITTED INTO THE HOSPITAL YOU ARE ALLOWED ONLY FOUR SUPPORT PEOPLE DURING VISITATION HOURS ONLY (7 AM -8PM)   The support person(s) must pass our screening, gel in and out, and wear a mask at all times, including in the patient's room. Patients must also wear a mask when staff or their support person are in the room. Visitors GUEST BADGE MUST BE WORN VISIBLY  One adult visitor may remain with you overnight and MUST be in the room by 8 P.M.     Your procedure is scheduled on: 11/20/22   Report to [facility] Main Entrance    Report to admitting at : 5:15 AM   Call this number if you have problems the morning of surgery [phone]   Clear liquids starting the day before surgery until : 4:30 AM DAY OF SURGERY. Drink plenty clear fluids the day before surgery.  Water Black Coffee (sugar ok, NO MILK/CREAM OR CREAMERS)  Tea (sugar ok, NO MILK/CREAM OR CREAMERS) regular and decaf                             Plain Jell-O (NO RED)                                           Fruit ices (not with fruit pulp, NO RED)                                     Popsicles (NO RED)                                                                  Juice: apple, WHITE grape, WHITE cranberry Sports drinks like Gatorade (NO RED)              Drink 2 G2 drinks AT 10:00 PM the night before surgery.      The day of surgery:  Drink ONE (1) Pre-Surgery Clear G2 at : 4:30 AM the morning of surgery. Drink in one sitting. Do not sip.  This drink was given to you during your hospital  pre-op appointment visit. Nothing else to drink after completing the  Pre-Surgery Clear Ensure or G2.          If you have questions, please contact your surgeon's office.   FOLLOW BOWEL PREP  AND ANY ADDITIONAL PRE OP INSTRUCTIONS YOU RECEIVED FROM YOUR SURGEON'S OFFICE!!!   Oral Hygiene is also important to reduce your risk of infection.                                    Remember - BRUSH YOUR TEETH THE MORNING OF SURGERY WITH YOUR REGULAR TOOTHPASTE  DENTURES  WILL BE REMOVED PRIOR TO SURGERY PLEASE DO NOT APPLY "Poly grip" OR ADHESIVES!!!   Do NOT smoke after Midnight   Take these medicines the morning of surgery with A SIP OF WATER: N/A  How to Manage Your Diabetes Before and After Surgery  Why is it important to control my blood sugar before and after surgery? Improving blood sugar levels before and after surgery helps healing and can limit problems. A way of improving blood sugar control is eating a healthy diet by:  Eating less sugar and carbohydrates  Increasing activity/exercise  Talking with your doctor about reaching your blood sugar goals High blood sugars (greater than 180 mg/dL) can raise your risk of infections and slow your recovery, so you will need to focus on controlling your diabetes during the weeks before surgery. Make sure that the doctor who takes care of your diabetes knows about your planned surgery including the date and location.  How do I manage my blood sugar before surgery? Check your blood sugar at least 4 times a day, starting 2 days before surgery, to make sure that the level is not too high or low. Check your blood sugar the morning of your surgery when you wake up and every 2 hours until you get to the Short Stay unit. If your blood sugar is less than 70 mg/dL, you will need to treat for low blood sugar: Do not take insulin. Treat a low blood sugar (less than 70 mg/dL) with  cup of clear juice (cranberry or apple), 4 glucose tablets, OR glucose gel. Recheck blood sugar in 15 minutes after treatment (to make sure it is greater than 70 mg/dL). If your blood sugar is not greater than 70 mg/dL on recheck, call [phone] for further  instructions. Report your blood sugar to the short stay nurse when you get to Short Stay.  If you are admitted to the hospital after surgery: Your blood sugar will be checked by the staff and you will probably be given insulin after surgery (instead of oral diabetes medicines) to make sure you have good blood sugar levels. The goal for blood sugar control after surgery is 80-180 mg/dL.   WHAT DO I DO ABOUT MY DIABETES MEDICATION?.  THE NIGHT BEFORE SURGERY, take ONLY half of the basaglar insulin.    THE MORNING OF SURGERY, Do not take oral diabetes medicines (pills) the morning of surgery  DO NOT TAKE THE FOLLOWING 7 DAYS PRIOR TO SURGERY: Ozempic, Wegovy, Rybelsus (Semaglutide), Byetta (exenatide), Bydureon (exenatide ER), Victoza, Saxenda (liraglutide), or Trulicity (dulaglutide) Mounjaro (Tirzepatide) Adlyxin (Lixisenatide), Polyethylene Glycol Loxenatide.                                You may not have any metal on your body including hair pins, jewelry, and body piercing             Do not wear lotions, powders, perfumes/cologne, or deodorant              Men may shave face and neck.   Do not bring valuables to the hospital. [facility] IS NOT             RESPONSIBLE   FOR VALUABLES.   Contacts, glasses, or bridgework may not be worn into surgery.   Bring small overnight bag day of surgery.   DO NOT BRING YOUR HOME MEDICATIONS TO THE HOSPITAL. PHARMACY WILL DISPENSE MEDICATIONS LISTED ON YOUR MEDICATION  LIST TO YOU DURING YOUR ADMISSION IN THE HOSPITAL!    Patients discharged on the day of surgery will not be allowed to drive home.  Someone NEEDS to stay with you for the first 24 hours after anesthesia.   Special Instructions: Bring a copy of your healthcare power of attorney and living will documents         the day of surgery if you haven't scanned them before.              Please read over the following fact sheets you were given: IF YOU HAVE QUESTIONS ABOUT YOUR PRE-OP  INSTRUCTIONS PLEASE CALL [phone]    [facility] - Preparing for Surgery Before surgery, you can play an important role.  Because skin is not sterile, your skin needs to be as free of germs as possible.  You can reduce the number of germs on your skin by washing with CHG (chlorahexidine gluconate) soap before surgery.  CHG is an antiseptic cleaner which kills germs and bonds with the skin to continue killing germs even after washing. Please DO NOT use if you have an allergy to CHG or antibacterial soaps.  If your skin becomes reddened/irritated stop using the CHG and inform your nurse when you arrive at Short Stay. Do not shave (including legs and underarms) for at least 48 hours prior to the first CHG shower.  You may shave your face/neck. Please follow these instructions carefully:  1.  Shower with CHG Soap the night before surgery and the  morning of Surgery.  2.  If you choose to wash your hair, wash your hair first as usual with your  normal  shampoo.  3.  After you shampoo, rinse your hair and body thoroughly to remove the  shampoo.                           4.  Use CHG as you would any other liquid soap.  You can apply chg directly  to the skin and wash                       Gently with a scrungie or clean washcloth.  5.  Apply the CHG Soap to your body ONLY FROM THE NECK DOWN.   Do not use on face/ open                           Wound or open sores. Avoid contact with eyes, ears mouth and genitals (private parts).                       Wash face,  Genitals (private parts) with your normal soap.             6.  Wash thoroughly, paying special attention to the area where your surgery  will be performed.  7.  Thoroughly rinse your body with warm water from the neck down.  8.  DO NOT shower/wash with your normal soap after using and rinsing off  the CHG Soap.                9.  Pat yourself dry with a clean towel.            10.  Wear clean pajamas.            11.  Place clean sheets on  your bed the  night of your first shower and do not  sleep with pets. Day of Surgery : Do not apply any lotions/deodorants the morning of surgery.  Please wear clean clothes to the hospital/surgery center.  FAILURE TO FOLLOW THESE INSTRUCTIONS MAY RESULT IN THE CANCELLATION OF YOUR SURGERY PATIENT SIGNATURE_________________________________  NURSE SIGNATURE__________________________________  ________________________________________________________________________

## 2022-11-14 ENCOUNTER — Other Ambulatory Visit: Payer: Self-pay

## 2022-11-14 ENCOUNTER — Encounter (HOSPITAL_COMMUNITY): Payer: Self-pay

## 2022-11-14 ENCOUNTER — Encounter (HOSPITAL_COMMUNITY)
Admission: RE | Admit: 2022-11-14 | Discharge: 2022-11-14 | Disposition: A | Discharge status: Home / Self Care | Payer: BC Managed Care – PPO | Source: Ambulatory Visit | Attending: Surgery | Admitting: Surgery

## 2022-11-14 VITALS — BP 132/90 | HR 79 | Temp 98.3°F | Ht 67.0 in | Wt 199.0 lb

## 2022-11-14 DIAGNOSIS — Z794 Long term (current) use of insulin: Secondary | ICD-10-CM | POA: Insufficient documentation

## 2022-11-14 DIAGNOSIS — Z01818 Encounter for other preprocedural examination: Secondary | ICD-10-CM | POA: Diagnosis not present

## 2022-11-14 DIAGNOSIS — I10 Essential (primary) hypertension: Secondary | ICD-10-CM | POA: Diagnosis not present

## 2022-11-14 DIAGNOSIS — E119 Type 2 diabetes mellitus without complications: Secondary | ICD-10-CM | POA: Diagnosis not present

## 2022-11-14 LAB — CBC
HCT: 50.5 % (ref 39.0–52.0)
Hemoglobin: 15.9 g/dL (ref 13.0–17.0)
MCH: 26.1 pg (ref 26.0–34.0)
MCHC: 31.5 g/dL (ref 30.0–36.0)
MCV: 82.9 fL (ref 80.0–100.0)
Platelets: 280 10*3/uL (ref 150–400)
RBC: 6.09 MIL/uL — ABNORMAL HIGH (ref 4.22–5.81)
RDW: 15.8 % — ABNORMAL HIGH (ref 11.5–15.5)
WBC: 9.5 10*3/uL (ref 4.0–10.5)
nRBC: 0 % (ref 0.0–0.2)

## 2022-11-14 LAB — BASIC METABOLIC PANEL
Anion gap: 10 (ref 5–15)
BUN: 17 mg/dL (ref 6–20)
CO2: 25 mmol/L (ref 22–32)
Calcium: 9.4 mg/dL (ref 8.9–10.3)
Chloride: 104 mmol/L (ref 98–111)
Creatinine, Ser: 1.08 mg/dL (ref 0.61–1.24)
GFR, Estimated: >60 mL/min (ref >60)
Glucose, Bld: 131 mg/dL — ABNORMAL HIGH (ref 70–99)
Potassium: 3.8 mmol/L (ref 3.5–5.1)
Sodium: 139 mmol/L (ref 135–145)

## 2022-11-14 LAB — HEMOGLOBIN A1C
Hgb A1c MFr Bld: 7.2 % — ABNORMAL HIGH (ref 4.8–5.6)
Mean Plasma Glucose: 159.94 mg/dL

## 2022-11-14 LAB — GLUCOSE, CAPILLARY: Glucose-Capillary: 163 mg/dL — ABNORMAL HIGH (ref 70–99)

## 2022-11-14 NOTE — Progress Notes (Signed)
For Short Stay: COVID SWAB appointment date:  Bowel Prep reminder:   For Anesthesia: PCP - Dr. Sheliah Hatch Cardiologist - N/A  Chest x-ray - 12/26/21 EKG -  Stress Test -  ECHO -  Cardiac Cath -  Pacemaker/ICD device last checked: Pacemaker orders received: Device Rep notified:  Spinal Cord Stimulator: N/A  Sleep Study - N/A CPAP -   Fasting Blood Sugar - 80's - 90's Checks Blood Sugar : continuous CBG monitor. Date and result of last Hgb A1c- 6.8: 08/14/22  Last dose of GLP1 agonist- N/A GLP1 instructions:   Last dose of SGLT-2 inhibitors- N/A SGLT-2 instructions:   Blood Thinner Instructions: Aspirin Instructions: Last Dose:  Activity level: Can go up a flight of stairs and activities of daily living without stopping and without chest pain and/or shortness of breath   Able to exercise without chest pain and/or shortness of breath  Anesthesia review: Hx: DIA,HTN  Patient denies shortness of breath, fever, cough and chest pain at PAT appointment   Patient verbalized understanding of instructions that were given to them at the PAT appointment. Patient was also instructed that they will need to review over the PAT instructions again at home before surgery.

## 2022-11-18 ENCOUNTER — Other Ambulatory Visit: Payer: Self-pay

## 2022-11-18 DIAGNOSIS — I10 Essential (primary) hypertension: Secondary | ICD-10-CM

## 2022-11-18 MED ORDER — LOSARTAN POTASSIUM-HCTZ 100-25 MG PO TABS
1.0000 | ORAL_TABLET | Freq: Every day | ORAL | 0 refills | Status: DC
Start: 2022-11-18 — End: 2023-04-29

## 2022-11-19 NOTE — Anesthesia Preprocedure Evaluation (Signed)
Anesthesia Evaluation  Patient identified by MRN, date of birth, ID band Patient awake    Reviewed: Allergy & Precautions, NPO status , Patient's Chart, lab work & pertinent test results  Airway Mallampati: III  TM Distance: >3 FB Neck ROM: Full    Dental no notable dental hx.    Pulmonary  Snores at night, no witnessed apneas, has never been tested for OSA    Pulmonary exam normal breath sounds clear to auscultation       Cardiovascular hypertension (112/83 preop), Pt. on medications + Peripheral Vascular Disease  Normal cardiovascular exam Rhythm:Regular Rate:Normal     Neuro/Psych  Headaches  negative psych ROS   GI/Hepatic Neg liver ROS,,,Appendiceal polyp    Endo/Other  diabetes, Well Controlled, Type 2, Insulin Dependent, Oral Hypoglycemic Agents  A1c 7.2 FS 120 preop  Renal/GU negative Renal ROS  negative genitourinary   Musculoskeletal negative musculoskeletal ROS (+)    Abdominal  (+) + obese  Peds  Hematology negative hematology ROS (+)   Anesthesia Other Findings   Reproductive/Obstetrics negative OB ROS                             Anesthesia Physical Anesthesia Plan  ASA: 2  Anesthesia Plan: General   Post-op Pain Management: Tylenol PO (pre-op)*, Toradol IV (intra-op)* and Dilaudid IV   Induction: Intravenous  PONV Risk Score and Plan: 3 and Ondansetron, Dexamethasone, Midazolam and Treatment may vary due to age or medical condition  Airway Management Planned: Oral ETT  Additional Equipment: None  Intra-op Plan:   Post-operative Plan: Extubation in OR  Informed Consent: I have reviewed the patients History and Physical, chart, labs and discussed the procedure including the risks, benefits and alternatives for the proposed anesthesia with the patient or authorized representative who has indicated his/her understanding and acceptance.     Dental advisory  given  Plan Discussed with: CRNA  Anesthesia Plan Comments:        Anesthesia Quick Evaluation

## 2022-11-20 ENCOUNTER — Ambulatory Visit (HOSPITAL_COMMUNITY): Payer: BC Managed Care – PPO | Admitting: Certified Registered Nurse Anesthetist

## 2022-11-20 ENCOUNTER — Other Ambulatory Visit: Payer: Self-pay

## 2022-11-20 ENCOUNTER — Encounter (HOSPITAL_COMMUNITY)
Admission: RE | Disposition: A | Discharge status: Home / Self Care | Payer: Self-pay | Source: Ambulatory Visit | Attending: Surgery

## 2022-11-20 ENCOUNTER — Encounter (HOSPITAL_COMMUNITY): Payer: Self-pay | Admitting: Surgery

## 2022-11-20 ENCOUNTER — Ambulatory Visit (HOSPITAL_COMMUNITY)
Admission: RE | Admit: 2022-11-20 | Discharge: 2022-11-20 | Disposition: A | Discharge status: Home / Self Care | Payer: BC Managed Care – PPO | Source: Ambulatory Visit | Attending: Surgery | Admitting: Surgery

## 2022-11-20 DIAGNOSIS — Z794 Long term (current) use of insulin: Secondary | ICD-10-CM | POA: Insufficient documentation

## 2022-11-20 DIAGNOSIS — E119 Type 2 diabetes mellitus without complications: Secondary | ICD-10-CM | POA: Diagnosis not present

## 2022-11-20 DIAGNOSIS — I10 Essential (primary) hypertension: Secondary | ICD-10-CM | POA: Diagnosis not present

## 2022-11-20 DIAGNOSIS — Z7984 Long term (current) use of oral hypoglycemic drugs: Secondary | ICD-10-CM | POA: Diagnosis not present

## 2022-11-20 DIAGNOSIS — D121 Benign neoplasm of appendix: Secondary | ICD-10-CM | POA: Insufficient documentation

## 2022-11-20 DIAGNOSIS — K388 Other specified diseases of appendix: Secondary | ICD-10-CM | POA: Diagnosis not present

## 2022-11-20 HISTORY — PX: LAPAROSCOPIC APPENDECTOMY: SHX408

## 2022-11-20 LAB — GLUCOSE, CAPILLARY
Glucose-Capillary: 120 mg/dL — ABNORMAL HIGH (ref 70–99)
Glucose-Capillary: 144 mg/dL — ABNORMAL HIGH (ref 70–99)

## 2022-11-20 SURGERY — APPENDECTOMY, LAPAROSCOPIC
Anesthesia: General

## 2022-11-20 MED ORDER — HYDROMORPHONE HCL 1 MG/ML IJ SOLN
INTRAMUSCULAR | Status: AC
  Filled 2022-11-20: qty 1

## 2022-11-20 MED ORDER — ENSURE PRE-SURGERY PO LIQD: 296.0000 mL | Freq: Once | ORAL | Status: DC

## 2022-11-20 MED ORDER — DEXAMETHASONE SODIUM PHOSPHATE 10 MG/ML IJ SOLN
INTRAMUSCULAR | Status: AC
  Filled 2022-11-20: qty 1

## 2022-11-20 MED ORDER — BUPIVACAINE HCL (PF) 0.25 % IJ SOLN
INTRAMUSCULAR | Status: AC
  Filled 2022-11-20: qty 30

## 2022-11-20 MED ORDER — ROCURONIUM BROMIDE 10 MG/ML (PF) SYRINGE
PREFILLED_SYRINGE | INTRAVENOUS | Status: DC | PRN
  Administered 2022-11-20: 70 mg via INTRAVENOUS

## 2022-11-20 MED ORDER — LACTATED RINGERS IV SOLN: INTRAVENOUS | Status: DC

## 2022-11-20 MED ORDER — HYDROMORPHONE HCL 1 MG/ML IJ SOLN
0.2500 mg | INTRAMUSCULAR | Status: DC | PRN
  Administered 2022-11-20 (×2): 0.5 mg via INTRAVENOUS

## 2022-11-20 MED ORDER — ONDANSETRON HCL 4 MG/2ML IJ SOLN
INTRAMUSCULAR | Status: AC
  Filled 2022-11-20: qty 2

## 2022-11-20 MED ORDER — ACETAMINOPHEN 500 MG PO TABS
1000.0000 mg | ORAL_TABLET | ORAL | Status: AC
  Administered 2022-11-20: 1000 mg via ORAL
  Filled 2022-11-20: qty 2

## 2022-11-20 MED ORDER — ACETAMINOPHEN 500 MG PO TABS: 1000.0000 mg | ORAL_TABLET | Freq: Once | ORAL | Status: DC

## 2022-11-20 MED ORDER — ONDANSETRON HCL 4 MG/2ML IJ SOLN
4.0000 mg | Freq: Once | INTRAMUSCULAR | Status: AC | PRN
  Administered 2022-11-20: 4 mg via INTRAVENOUS

## 2022-11-20 MED ORDER — HYDROCODONE-ACETAMINOPHEN 5-325 MG PO TABS
1.0000 | ORAL_TABLET | Freq: Four times a day (QID) | ORAL | 0 refills | Status: DC | PRN
Start: 2022-11-20 — End: 2023-05-20

## 2022-11-20 MED ORDER — AMISULPRIDE (ANTIEMETIC) 5 MG/2ML IV SOLN
INTRAVENOUS | Status: AC
  Administered 2022-11-20: 10 mg via INTRAVENOUS
  Filled 2022-11-20: qty 4

## 2022-11-20 MED ORDER — DEXAMETHASONE SODIUM PHOSPHATE 10 MG/ML IJ SOLN
INTRAMUSCULAR | Status: DC | PRN
  Administered 2022-11-20: 4 mg via INTRAVENOUS

## 2022-11-20 MED ORDER — LIDOCAINE 2% (20 MG/ML) 5 ML SYRINGE
INTRAMUSCULAR | Status: DC | PRN
  Administered 2022-11-20: 60 mg via INTRAVENOUS

## 2022-11-20 MED ORDER — ENOXAPARIN SODIUM 40 MG/0.4ML IJ SOSY
40.0000 mg | PREFILLED_SYRINGE | Freq: Once | INTRAMUSCULAR | Status: AC
  Administered 2022-11-20: 40 mg via SUBCUTANEOUS
  Filled 2022-11-20: qty 0.4

## 2022-11-20 MED ORDER — CHLORHEXIDINE GLUCONATE 0.12 % MT SOLN
15.0000 mL | Freq: Once | OROMUCOSAL | Status: AC
  Administered 2022-11-20: 15 mL via OROMUCOSAL

## 2022-11-20 MED ORDER — HYDROMORPHONE HCL 1 MG/ML IJ SOLN
INTRAMUSCULAR | Status: DC | PRN
  Administered 2022-11-20: .5 mg via INTRAVENOUS

## 2022-11-20 MED ORDER — PROPOFOL 10 MG/ML IV BOLUS
INTRAVENOUS | Status: DC | PRN
  Administered 2022-11-20: 200 mg via INTRAVENOUS

## 2022-11-20 MED ORDER — LACTATED RINGERS IR SOLN
Status: DC | PRN
  Administered 2022-11-20: 1000 mL

## 2022-11-20 MED ORDER — PROPOFOL 10 MG/ML IV BOLUS
INTRAVENOUS | Status: AC
  Filled 2022-11-20: qty 20

## 2022-11-20 MED ORDER — SODIUM CHLORIDE 0.9 % IV SOLN
2.0000 g | INTRAVENOUS | Status: AC
  Administered 2022-11-20: 2 g via INTRAVENOUS
  Filled 2022-11-20: qty 2

## 2022-11-20 MED ORDER — OXYCODONE HCL 5 MG PO TABS: 5.0000 mg | ORAL_TABLET | Freq: Once | ORAL | Status: AC | PRN

## 2022-11-20 MED ORDER — MEPERIDINE HCL 50 MG/ML IJ SOLN: 6.2500 mg | INTRAMUSCULAR | Status: DC | PRN

## 2022-11-20 MED ORDER — ENSURE PRE-SURGERY PO LIQD: 592.0000 mL | Freq: Once | ORAL | Status: DC

## 2022-11-20 MED ORDER — HYDROMORPHONE HCL 2 MG/ML IJ SOLN
INTRAMUSCULAR | Status: AC
  Filled 2022-11-20: qty 1

## 2022-11-20 MED ORDER — AMISULPRIDE (ANTIEMETIC) 5 MG/2ML IV SOLN: 10.0000 mg | Freq: Once | INTRAVENOUS | Status: AC | PRN

## 2022-11-20 MED ORDER — CELECOXIB 200 MG PO CAPS
200.0000 mg | ORAL_CAPSULE | ORAL | Status: AC
  Administered 2022-11-20: 200 mg via ORAL
  Filled 2022-11-20: qty 1

## 2022-11-20 MED ORDER — HYDROMORPHONE HCL 1 MG/ML IJ SOLN
INTRAMUSCULAR | Status: AC
  Administered 2022-11-20: 0.5 mg via INTRAVENOUS
  Filled 2022-11-20: qty 1

## 2022-11-20 MED ORDER — LIDOCAINE HCL (PF) 2 % IJ SOLN
INTRAMUSCULAR | Status: AC
  Filled 2022-11-20: qty 5

## 2022-11-20 MED ORDER — OXYCODONE HCL 5 MG/5ML PO SOLN: 5.0000 mg | Freq: Once | ORAL | Status: AC | PRN

## 2022-11-20 MED ORDER — KETOROLAC TROMETHAMINE 30 MG/ML IJ SOLN: 30.0000 mg | Freq: Once | INTRAMUSCULAR | Status: DC | PRN

## 2022-11-20 MED ORDER — ROCURONIUM BROMIDE 10 MG/ML (PF) SYRINGE
PREFILLED_SYRINGE | INTRAVENOUS | Status: AC
  Filled 2022-11-20: qty 10

## 2022-11-20 MED ORDER — FENTANYL CITRATE (PF) 100 MCG/2ML IJ SOLN
INTRAMUSCULAR | Status: AC
  Filled 2022-11-20: qty 2

## 2022-11-20 MED ORDER — OXYCODONE HCL 5 MG PO TABS
ORAL_TABLET | ORAL | Status: AC
  Administered 2022-11-20: 5 mg via ORAL
  Filled 2022-11-20: qty 1

## 2022-11-20 MED ORDER — FENTANYL CITRATE (PF) 100 MCG/2ML IJ SOLN
INTRAMUSCULAR | Status: DC | PRN
  Administered 2022-11-20: 50 ug via INTRAVENOUS
  Administered 2022-11-20: 100 ug via INTRAVENOUS
  Administered 2022-11-20: 50 ug via INTRAVENOUS

## 2022-11-20 MED ORDER — ORAL CARE MOUTH RINSE: 15.0000 mL | Freq: Once | OROMUCOSAL | Status: AC

## 2022-11-20 MED ORDER — 0.9 % SODIUM CHLORIDE (POUR BTL) OPTIME
TOPICAL | Status: DC | PRN
  Administered 2022-11-20: 1000 mL

## 2022-11-20 MED ORDER — MIDAZOLAM HCL 2 MG/2ML IJ SOLN
INTRAMUSCULAR | Status: DC | PRN
  Administered 2022-11-20: 2 mg via INTRAVENOUS

## 2022-11-20 MED ORDER — BUPIVACAINE HCL (PF) 0.25 % IJ SOLN
INTRAMUSCULAR | Status: DC | PRN
  Administered 2022-11-20: 20 mL

## 2022-11-20 MED ORDER — SUGAMMADEX SODIUM 200 MG/2ML IV SOLN
INTRAVENOUS | Status: DC | PRN
  Administered 2022-11-20: 200 mg via INTRAVENOUS

## 2022-11-20 MED ORDER — MIDAZOLAM HCL 2 MG/2ML IJ SOLN
INTRAMUSCULAR | Status: AC
  Filled 2022-11-20: qty 2

## 2022-11-20 MED ORDER — ONDANSETRON HCL 4 MG/2ML IJ SOLN
INTRAMUSCULAR | Status: DC | PRN
  Administered 2022-11-20: 4 mg via INTRAVENOUS

## 2022-11-20 SURGICAL SUPPLY — 55 items
ADH SKN CLS APL DERMABOND .7 (GAUZE/BANDAGES/DRESSINGS) ×1
APL PRP STRL LF DISP 70% ISPRP (MISCELLANEOUS) ×1
APPLIER CLIP 5 13 M/L LIGAMAX5 (MISCELLANEOUS) ×1
APPLIER CLIP ROT 10 11.4 M/L (STAPLE)
APR CLP MED LRG 11.4X10 (STAPLE)
APR CLP MED LRG 5 ANG JAW (MISCELLANEOUS) ×1
BAG COUNTER SPONGE SURGICOUNT (BAG) IMPLANT
BAG SPNG CNTER NS LX DISP (BAG)
CABLE HIGH FREQUENCY MONO STRZ (ELECTRODE) ×1 IMPLANT
CHLORAPREP W/TINT 26 (MISCELLANEOUS) ×1 IMPLANT
CLIP APPLIE 5 13 M/L LIGAMAX5 (MISCELLANEOUS) IMPLANT
CLIP APPLIE ROT 10 11.4 M/L (STAPLE) IMPLANT
COVER SURGICAL LIGHT HANDLE (MISCELLANEOUS) ×1 IMPLANT
CUTTER FLEX LINEAR 45M (STAPLE) IMPLANT
DERMABOND ADVANCED .7 DNX12 (GAUZE/BANDAGES/DRESSINGS) ×1 IMPLANT
DRAIN CHANNEL 19F RND (DRAIN) IMPLANT
ELECT REM PT RETURN 15FT ADLT (MISCELLANEOUS) ×1 IMPLANT
ENDOLOOP SUT PDS II  0 18 (SUTURE)
ENDOLOOP SUT PDS II 0 18 (SUTURE) IMPLANT
EVACUATOR SILICONE 100CC (DRAIN) IMPLANT
GLOVE BIO SURGEON STRL SZ 6 (GLOVE) ×1 IMPLANT
GLOVE BIOGEL PI MICRO STRL 5.5 (GLOVE) ×1 IMPLANT
GLOVE INDICATOR 6.5 STRL GRN (GLOVE) ×1 IMPLANT
GOWN STRL REUS W/ TWL LRG LVL3 (GOWN DISPOSABLE) ×1 IMPLANT
GOWN STRL REUS W/TWL LRG LVL3 (GOWN DISPOSABLE) ×1
GRASPER SUT TROCAR 14GX15 (MISCELLANEOUS) IMPLANT
IRRIG SUCT STRYKERFLOW 2 WTIP (MISCELLANEOUS) ×1
IRRIGATION SUCT STRKRFLW 2 WTP (MISCELLANEOUS) ×1 IMPLANT
KIT BASIN OR (CUSTOM PROCEDURE TRAY) ×1 IMPLANT
KIT TURNOVER KIT A (KITS) IMPLANT
NDL INSUFFLATION 14GA 120MM (NEEDLE) IMPLANT
NEEDLE INSUFFLATION 14GA 120MM (NEEDLE) IMPLANT
PENCIL SMOKE EVACUATOR (MISCELLANEOUS) IMPLANT
RELOAD 45 VASCULAR/THIN (ENDOMECHANICALS) IMPLANT
RELOAD STAPLE 45 2.5 WHT GRN (ENDOMECHANICALS) IMPLANT
RELOAD STAPLE 45 3.5 BLU ETS (ENDOMECHANICALS) IMPLANT
RELOAD STAPLE TA45 3.5 REG BLU (ENDOMECHANICALS) ×3 IMPLANT
SCISSORS LAP 5X35 DISP (ENDOMECHANICALS) ×1 IMPLANT
SET TUBE SMOKE EVAC HIGH FLOW (TUBING) ×1 IMPLANT
SHEARS HARMONIC ACE PLUS 36CM (ENDOMECHANICALS) IMPLANT
SLEEVE Z-THREAD 5X100MM (TROCAR) ×1 IMPLANT
SPIKE FLUID TRANSFER (MISCELLANEOUS) ×1 IMPLANT
SUT ETHILON 2 0 PS N (SUTURE) IMPLANT
SUT MNCRL AB 4-0 PS2 18 (SUTURE) ×1 IMPLANT
SUT VICRYL 0 UR6 27IN ABS (SUTURE) IMPLANT
SYS BAG RETRIEVAL 10MM (BASKET) ×1
SYSTEM BAG RETRIEVAL 10MM (BASKET) ×1 IMPLANT
TOWEL OR 17X26 10 PK STRL BLUE (TOWEL DISPOSABLE) ×1 IMPLANT
TOWEL OR NON WOVEN STRL DISP B (DISPOSABLE) ×1 IMPLANT
TRAY FOLEY MTR SLVR 14FR STAT (SET/KITS/TRAYS/PACK) IMPLANT
TRAY FOLEY MTR SLVR 16FR STAT (SET/KITS/TRAYS/PACK) IMPLANT
TRAY LAPAROSCOPIC (CUSTOM PROCEDURE TRAY) ×1 IMPLANT
TROCAR ADV FIXATION 12X100MM (TROCAR) ×1 IMPLANT
TROCAR BALLN 12MMX100 BLUNT (TROCAR) ×1 IMPLANT
TROCAR Z-THREAD OPTICAL 5X100M (TROCAR) ×1 IMPLANT

## 2022-11-20 NOTE — Op Note (Signed)
Date: 11/20/22  Patient: Lee Greene MRN: [id number]  Preoperative Diagnosis: Appendiceal orifice polyp Postoperative Diagnosis: Same  Procedure: Laparoscopic appendectomy  Surgeon: Sophronia Simas, MD  EBL: Minimal  Anesthesia: General endotracheal  Specimens: Appendix  Indications: Mr. Gogol is a 62 yo male who underwent a colonoscopy in March of this year, and had a polyp in the appendiceal orifice which could not be endoscopically resected. He was referred for appendectomy. After a discussion of the risks and benefits of surgery, he agreed to proceed with appendectomy, with possible ileocecectomy if the polyp could not be completely resected with appendectomy.  Findings: Small benign-appearing polyp at the appendiceal orifice completely resected with appendectomy.  Procedure details: Informed consent was obtained in the preoperative area prior to the procedure. The patient was brought to the operating room and placed on the table in the supine position. General anesthesia was induced and appropriate lines and drains were placed for intraoperative monitoring. Perioperative antibiotics were administered per SCIP guidelines. The abdomen was prepped and draped in the usual sterile fashion. A pre-procedure timeout was taken verifying patient identity, surgical site and procedure to be performed.  A small infraumbilical skin incision was made and the subcutaneous tissue was spread to expose the fascia. The umbilical stalk was grasped and elevated, and the fascia was sharply incised. The peritoneal cavity was visualized and a 12mm Hasson trocar was inserted. The abdomen was inspected with no evidence of visceral or vascular injury. A suprapubic 5mm port was placed, followed by a 5mm port in the LLQ, both under direct visualization. The cecum and appendix were identified. The cecum was partially mobilized along the line of Toldt using Harmonic shears. The appendix was grasped and  elevated, and the mesoappendix was divided at the base of the appendix with Harmonic shears. A 45mm stapler with a blue load was used to divide a portion of the cecum adjacent to the appendix, taking care not to injure the ileocecal valve. A total of three staple loads were used to divide the cecum. The specimen was placed in an endocatch bag and removed. The specimen was opened on the back table, and a small polyp was visible at the appendiceal orifice. The polyp had been entirely resected within the specimen. It fragmented slightly on opening the specimen, but it was clear the entire polyp had been resected. The specimen was sent for routine pathology. The surgical site was irrigated. The cecal staple line had a small amount of oozing, which was controlled with clip placement. The ports were removed under direct visualization and the pneumoperitoneum was evacuated. The umbilical port site fascia was closed with a 0 Vicryl figure-of-eight suture. The skin at all port sites was closed with 4-0 monocryl subcuticular suture. Dermabond was applied.  The patient tolerated the procedure well with no apparent complications. All counts were correct x2 at the end of the procedure. The patient was extubated and taken to PACU in stable condition.  Sophronia Simas, MD 11/20/22 9:15 AM

## 2022-11-20 NOTE — OR Nursing (Signed)
Patient unable to void after multiple attempts. Lee Busman MD called in OR room, patient able to discharge home but if unable to void in 6 hours, patient needs to go to ER. Patient and wife made aware.

## 2022-11-20 NOTE — Anesthesia Procedure Notes (Signed)
Procedure Name: Intubation Date/Time: 11/20/2022 8:05 AM  Performed by: Sindy Guadeloupe, CRNAPre-anesthesia Checklist: Patient identified, Emergency Drugs available, Suction available, Patient being monitored and Timeout performed Patient Re-evaluated:Patient Re-evaluated prior to induction Oxygen Delivery Method: Circle system utilized Preoxygenation: Pre-oxygenation with 100% oxygen Induction Type: IV induction Ventilation: Mask ventilation without difficulty Laryngoscope Size: Mac and 4 Grade View: Grade I Tube type: Oral Tube size: 7.5 mm Number of attempts: 1 Airway Equipment and Method: Stylet Placement Confirmation: ETT inserted through vocal cords under direct vision, positive ETCO2 and breath sounds checked- equal and bilateral Secured at: 23 cm Tube secured with: Tape Dental Injury: Teeth and Oropharynx as per pre-operative assessment

## 2022-11-20 NOTE — Transfer of Care (Signed)
Immediate Anesthesia Transfer of Care Note  Patient: Lee Greene  Procedure(s) Performed: LAPAROSCOPIC APPENDECTOMY  Patient Location: PACU  Anesthesia Type:General  Level of Consciousness: awake, drowsy, and patient cooperative  Airway & Oxygen Therapy: Patient Spontanous Breathing and Patient connected to face mask oxygen  Post-op Assessment: Report given to RN and Post -op Vital signs reviewed and stable  Post vital signs: Reviewed and stable  Last Vitals:  Vitals Value Taken Time  BP 136/82 11/20/22 0915  Temp    Pulse 71 11/20/22 0915  Resp 14 11/20/22 0915  SpO2 99 % 11/20/22 0915  Vitals shown include unvalidated device data.  Last Pain:  Vitals:   11/20/22 0653  TempSrc:   PainSc: 0-No pain         Complications: No notable events documented.

## 2022-11-20 NOTE — Interval H&P Note (Signed)
History and Physical Interval Note:  11/20/2022 7:42 AM  Lee Greene  has presented today for surgery, with the diagnosis of APPENDICEAL POLYP.  The various methods of treatment have been discussed with the patient and family. After consideration of risks, benefits and other options for treatment, the patient has consented to  Procedure(s): LAPAROSCOPIC APPENDECTOMY (N/A) POSSIBLE ILEOCECECTOMY (N/A) as a surgical intervention.  The patient's history has been reviewed, patient examined, no change in status, stable for surgery.  I have reviewed the patient's chart and labs.  Questions were answered to the patient's satisfaction.     Fritzi Mandes

## 2022-11-20 NOTE — Anesthesia Postprocedure Evaluation (Signed)
Anesthesia Post Note  Patient: Lee Greene  Procedure(s) Performed: LAPAROSCOPIC APPENDECTOMY     Patient location during evaluation: PACU Anesthesia Type: General Level of consciousness: awake and alert, oriented and patient cooperative Pain management: pain level controlled Vital Signs Assessment: post-procedure vital signs reviewed and stable Respiratory status: spontaneous breathing, nonlabored ventilation and respiratory function stable Cardiovascular status: blood pressure returned to baseline and stable Postop Assessment: no apparent nausea or vomiting Anesthetic complications: no   No notable events documented.  Last Vitals:  Vitals:   11/20/22 0945 11/20/22 1000  BP: 137/88 131/87  Pulse: 72 70  Resp: 18 15  Temp:    SpO2: 99% 99%    Last Pain:  Vitals:   11/20/22 1000  TempSrc:   PainSc: 6                  Lannie Fields

## 2022-11-20 NOTE — Discharge Instructions (Addendum)
[facility] DISCHARGE INSTRUCTIONS  Activity No heavy lifting greater than 15 pounds for 4 weeks after surgery. Ok to shower in 24 hours, but do not bathe or submerge incisions underwater. Do not drive while taking narcotic pain medication.  Wound Care Your incisions are covered with skin glue called Dermabond. This will peel off on its own over time. You may shower and allow warm soapy water to run over your incisions. Gently pat dry. Do not submerge your incision underwater. Monitor your incision for any new redness, tenderness, or drainage.  When to Call us: Fever greater than 100.5 New redness, drainage, or swelling at incision site Severe pain, nausea, or vomiting  Follow-up You have an appointment scheduled with Dr. Freida Busman on Dec 12, 2022 at 1:50pm. This will be at the [facility] office at [address], [address], [place], [place]. Please arrive at least 15 minutes prior to your scheduled appointment time.  For questions or concerns, please call the office at [phone].

## 2022-11-21 ENCOUNTER — Other Ambulatory Visit: Payer: Self-pay

## 2022-11-21 ENCOUNTER — Encounter (HOSPITAL_COMMUNITY): Payer: Self-pay | Admitting: Surgery

## 2022-11-21 DIAGNOSIS — E119 Type 2 diabetes mellitus without complications: Secondary | ICD-10-CM

## 2022-11-21 LAB — SURGICAL PATHOLOGY

## 2022-11-21 MED ORDER — FREESTYLE LIBRE 3 SENSOR MISC
3 refills | Status: DC
Start: 2022-11-21 — End: 2023-03-20

## 2023-01-05 DIAGNOSIS — E119 Type 2 diabetes mellitus without complications: Secondary | ICD-10-CM | POA: Diagnosis not present

## 2023-01-05 LAB — OPHTHALMOLOGY REPORT-SCANNED

## 2023-01-06 ENCOUNTER — Other Ambulatory Visit: Payer: Self-pay

## 2023-01-06 ENCOUNTER — Telehealth: Payer: Self-pay

## 2023-01-06 DIAGNOSIS — E119 Type 2 diabetes mellitus without complications: Secondary | ICD-10-CM

## 2023-01-06 MED ORDER — PENTIPS 32G X 4 MM MISC
1.0000 | Freq: Three times a day (TID) | 3 refills | Status: DC
Start: 2023-01-06 — End: 2023-05-20

## 2023-01-06 NOTE — Telephone Encounter (Signed)
Refill sent to pharmacy.   

## 2023-01-06 NOTE — Telephone Encounter (Signed)
Ok to refill as requested- these are his needles for diabetes meds

## 2023-01-06 NOTE — Telephone Encounter (Signed)
Pharmacy sent a refill request for Pentips 32 G X 4 MM  I do not see this in his chart . Can you assist ?

## 2023-01-30 DIAGNOSIS — H25811 Combined forms of age-related cataract, right eye: Secondary | ICD-10-CM | POA: Diagnosis not present

## 2023-03-13 ENCOUNTER — Encounter: Payer: Self-pay | Admitting: Family Medicine

## 2023-03-13 ENCOUNTER — Telehealth: Payer: BC Managed Care – PPO | Admitting: Family Medicine

## 2023-03-13 VITALS — Ht 67.0 in | Wt 200.0 lb

## 2023-03-13 DIAGNOSIS — U071 COVID-19: Secondary | ICD-10-CM

## 2023-03-13 MED ORDER — NIRMATRELVIR/RITONAVIR (PAXLOVID)TABLET: 3.0000 | ORAL_TABLET | Freq: Two times a day (BID) | ORAL | 0 refills | Status: AC

## 2023-03-13 MED ORDER — GUAIFENESIN-CODEINE 100-10 MG/5ML PO SYRP: 10.0000 mL | ORAL_SOLUTION | Freq: Three times a day (TID) | ORAL | 0 refills | Status: DC | PRN

## 2023-03-13 NOTE — Progress Notes (Signed)
Virtual Visit via Video   I connected with patient on 03/13/23 at  9:20 AM EDT by a video enabled telemedicine application and verified that I am speaking with the correct person using two identifiers.  Location patient: Home Location provider: [place] [facility], Office Persons participating in the virtual visit: Patient, Provider, CMA Sheryle Hail C)  I discussed the limitations of evaluation and management by telemedicine and the availability of in person appointments. The patient expressed understanding and agreed to proceed.  Subjective:   HPI:   COVID- tested + on Wednesday morning.  Sxs started Monday night w/ chills, sweats.  Started coughing on Tuesday.  Wednesday had excessive fatigue.  Tested + on Wednesday.  Denies chest tightness or SOB.  + HA- somewhat improved w/ Tylenol.  Pt reports drinking LOTS of fluids.  Cough is persistent- taking Nyquil.    ROS:   See pertinent positives and negatives per HPI.  Patient Active Problem List   Diagnosis Date Noted   Type 2 diabetes mellitus without complication, with long-term current use of insulin (HCC) 01/06/2022   Nephrolithiasis 07/31/2021   Obesity 07/31/2021   Type 2 diabetes mellitus with other circulatory complications (HCC) 07/31/2021   Personal history of colonic polyps 07/31/2021   Personal history of COVID-19 07/31/2021   History of colonic polyps 07/02/2021   Peripheral vascular disorder due to diabetes mellitus (HCC) 07/02/2021   History of COVID-19 08/24/2019   Overweight (BMI 25.0-29.9) 03/20/2017   Exertional chest pain 04/29/2013   Hypertension 04/29/2013   Physical exam, annual 01/29/2012   HTN (hypertension) 04/12/2010   Migraine 10/05/2008   RHINITIS 10/05/2008    Social History   Tobacco Use   Smoking status: Never    Passive exposure: Never   Smokeless tobacco: Never  Substance Use Topics   Alcohol use: No    Alcohol/week: 0.0 standard drinks of alcohol    Current Outpatient Medications:     Continuous Glucose Sensor (FREESTYLE LIBRE 3 SENSOR) MISC, Apply to skin once every 2 weeks, Disp: 2 each, Rfl: 3   Insulin Glargine (BASAGLAR KWIKPEN) 100 UNIT/ML, Inject 16 Units into the skin daily. (Patient taking differently: Inject 20 Units into the skin at bedtime.), Disp: 15 mL, Rfl: 6   Insulin Pen Needle (PENTIPS) 32G X 4 MM MISC, 1 Needle by Does not apply route in the morning, at noon, and at bedtime., Disp: 100 each, Rfl: 3   Insulin Pen Needle 32G X 4 MM MISC, 1 Device by Does not apply route daily in the afternoon., Disp: 100 each, Rfl: 3   losartan-hydrochlorothiazide (HYZAAR) 100-25 MG tablet, Take 1 tablet by mouth daily., Disp: 90 tablet, Rfl: 0   metFORMIN (GLUCOPHAGE XR) 500 MG 24 hr tablet, Take 2 tablets (1,000 mg total) by mouth 2 (two) times daily with a meal. (Patient taking differently: Take 500 mg by mouth 2 (two) times daily with a meal.), Disp: 360 tablet, Rfl: 3   Multiple Vitamins-Minerals (MULTIVITAMIN WITH MINERALS) tablet, Take 1 tablet by mouth daily., Disp: , Rfl:    HYDROcodone-acetaminophen (NORCO/VICODIN) 5-325 MG tablet, Take 1 tablet by mouth every 6 (six) hours as needed for severe pain. (Patient not taking: Reported on 03/13/2023), Disp: 15 tablet, Rfl: 0  No Known Allergies  Objective:   Ht 5\' 7"  (1.702 m)   Wt 200 lb (90.7 kg)   BMI 31.32 kg/m  AAOx3, NAD NCAT, EOMI No obvious CN deficits Coloring WNL Pt is able to speak clearly, coherently without shortness of breath or  increased work of breathing. + hacking cough Thought process is linear.  Mood is appropriate.   Assessment and Plan:   COVID- new.  Pt is just within the window to start Paxlovid.  Given his underlying diabetes, this makes sense.  Cough meds prn.  Reviewed supportive care and red flags that should prompt return.  Pt expressed understanding and is in agreement w/ plan.    Neena Rhymes, MD 03/13/2023

## 2023-03-20 ENCOUNTER — Other Ambulatory Visit: Payer: Self-pay

## 2023-03-20 DIAGNOSIS — E119 Type 2 diabetes mellitus without complications: Secondary | ICD-10-CM

## 2023-03-20 MED ORDER — FREESTYLE LIBRE 3 SENSOR MISC
3 refills | Status: DC
Start: 2023-03-20 — End: 2023-05-20

## 2023-04-28 ENCOUNTER — Telehealth: Payer: Self-pay | Admitting: Family Medicine

## 2023-04-29 ENCOUNTER — Other Ambulatory Visit: Payer: Self-pay

## 2023-04-29 ENCOUNTER — Telehealth: Payer: Self-pay | Admitting: Family Medicine

## 2023-04-29 DIAGNOSIS — I10 Essential (primary) hypertension: Secondary | ICD-10-CM

## 2023-04-29 MED ORDER — LOSARTAN POTASSIUM-HCTZ 100-25 MG PO TABS
1.0000 | ORAL_TABLET | Freq: Every day | ORAL | 0 refills | Status: DC
Start: 2023-04-29 — End: 2023-09-01

## 2023-04-29 NOTE — Telephone Encounter (Signed)
Caller name: ANTOINETTE HASKETT  On DPR?: Yes  Call back number: [phone] (mobile)  Provider they see: Sheliah Hatch, MD  Reason for call:   Pt states medication was denied Metformin is the med.

## 2023-04-29 NOTE — Telephone Encounter (Signed)
Was previously filled by Endo Dr Lonzo Cloud and had to stop seeing them due to insurance issues and is asking that you start filling this medication please advise patient reports no stop in his therapy but refills show a 9 month gape between March this year when patient should have run out and October of this year

## 2023-04-30 ENCOUNTER — Other Ambulatory Visit: Payer: Self-pay

## 2023-04-30 DIAGNOSIS — E1165 Type 2 diabetes mellitus with hyperglycemia: Secondary | ICD-10-CM

## 2023-04-30 MED ORDER — METFORMIN HCL ER 500 MG PO TB24
500.0000 mg | ORAL_TABLET | Freq: Two times a day (BID) | ORAL | 0 refills | Status: DC
Start: 2023-04-30 — End: 2023-07-07

## 2023-04-30 NOTE — Telephone Encounter (Signed)
Pt has been notified.

## 2023-04-30 NOTE — Telephone Encounter (Signed)
Ok to provide 90 day supply of medication with 1 refill

## 2023-04-30 NOTE — Telephone Encounter (Signed)
Refilled Metformin 90 day

## 2023-05-18 ENCOUNTER — Telehealth: Payer: Self-pay | Admitting: Family Medicine

## 2023-05-18 MED ORDER — BASAGLAR KWIKPEN 100 UNIT/ML ~~LOC~~ SOPN: 24.0000 [IU] | PEN_INJECTOR | Freq: Every day | SUBCUTANEOUS | 1 refills | Status: DC

## 2023-05-18 NOTE — Telephone Encounter (Signed)
Refill provided and dose updated to reflect his current 24 units

## 2023-05-18 NOTE — Telephone Encounter (Signed)
Pt has appt this week for Diabetes MGNT Pt states his dosage 100 units per ML

## 2023-05-18 NOTE — Telephone Encounter (Signed)
We can fill his Basaglar insulin once we confirm how many units he is taking nightly and update the prescription to reflect the current dose.  If he is not seeing Endo, he is overdue for an appt with me and we need to see him as soon as possible so we can update his labs and make sure we don't need to adjust any meds.  Please have him schedule in the next week or so

## 2023-05-18 NOTE — Telephone Encounter (Signed)
Pt states that pharmacy told him that his insulin was rejected but I'm not seeing anything in the chart to match this. Pt asked for CMA to call him back at convenience to help him understand why it could've been denied.

## 2023-05-18 NOTE — Telephone Encounter (Signed)
Yes, 100 units/ml but how many units is he taking each night?   That way I can send in the right prescription

## 2023-05-18 NOTE — Telephone Encounter (Signed)
Pt reports 24 units at night.

## 2023-05-18 NOTE — Telephone Encounter (Signed)
Please advise. Pt reports he does not see Endo any longer. Would you like to mgnt this ?

## 2023-05-20 ENCOUNTER — Encounter: Payer: Self-pay | Admitting: Family Medicine

## 2023-05-20 ENCOUNTER — Telehealth: Payer: Self-pay

## 2023-05-20 ENCOUNTER — Telehealth: Payer: Self-pay | Admitting: Family Medicine

## 2023-05-20 ENCOUNTER — Ambulatory Visit: Payer: BC Managed Care – PPO | Admitting: Family Medicine

## 2023-05-20 VITALS — BP 122/72 | HR 78 | Temp 98.2°F | Ht 67.0 in | Wt 205.5 lb

## 2023-05-20 DIAGNOSIS — E669 Obesity, unspecified: Secondary | ICD-10-CM

## 2023-05-20 DIAGNOSIS — E119 Type 2 diabetes mellitus without complications: Secondary | ICD-10-CM | POA: Diagnosis not present

## 2023-05-20 DIAGNOSIS — I10 Essential (primary) hypertension: Secondary | ICD-10-CM

## 2023-05-20 DIAGNOSIS — Z1159 Encounter for screening for other viral diseases: Secondary | ICD-10-CM

## 2023-05-20 DIAGNOSIS — Z114 Encounter for screening for human immunodeficiency virus [HIV]: Secondary | ICD-10-CM

## 2023-05-20 DIAGNOSIS — Z794 Long term (current) use of insulin: Secondary | ICD-10-CM

## 2023-05-20 LAB — MICROALBUMIN / CREATININE URINE RATIO
Creatinine,U: 261.6 mg/dL
Microalb Creat Ratio: 0.3 mg/g (ref 0.0–30.0)
Microalb, Ur: 0.8 mg/dL (ref 0.0–1.9)

## 2023-05-20 MED ORDER — DEXCOM G7 SENSOR MISC: 3 refills | Status: DC

## 2023-05-20 MED ORDER — PENTIPS 32G X 4 MM MISC: 1.0000 | Freq: Three times a day (TID) | 3 refills | Status: AC

## 2023-05-20 NOTE — Progress Notes (Signed)
   Subjective:    Patient ID: Lee Greene, male    DOB: 10-07-1960, 62 y.o.   MRN: [id number]  HPI DM- chronic problem, on Basaglar 24 units nightly, Metformin XR 500mg  BID.  On ARB for renal protection.  Pt reports he is UTD on eye exam- will get records.  Due for foot exam and microalbumin.  Pt reports 'feeling great'. No abd pain, N/V.  Denies symptomatic lows despite Novelty indicating that sugars were low.  Has a hard time getting a signal at times.  Wants to switch to Dexcom  HTN- chronic problem, on Lisinopril hydrochlorothiazide 100/25mg  daily w/ good control.  No CP, SOB, HA's, visual changes, edema.  Obesity- pt has gained 9 lbs since Feb.  Exercise has decreased recently.   Review of Systems For ROS see HPI     Objective:   Physical Exam Vitals reviewed.  Constitutional:      General: He is not in acute distress.    Appearance: Normal appearance. He is well-developed.  HENT:     Head: Normocephalic and atraumatic.  Eyes:     Extraocular Movements: Extraocular movements intact.     Conjunctiva/sclera: Conjunctivae normal.     Pupils: Pupils are equal, round, and reactive to light.  Neck:     Thyroid: No thyromegaly.  Cardiovascular:     Rate and Rhythm: Normal rate and regular rhythm.     Pulses: Normal pulses.     Heart sounds: Normal heart sounds. No murmur heard. Pulmonary:     Effort: Pulmonary effort is normal. No respiratory distress.     Breath sounds: Normal breath sounds.  Abdominal:     General: Bowel sounds are normal. There is no distension.     Palpations: Abdomen is soft.  Musculoskeletal:     Cervical back: Normal range of motion and neck supple.     Right lower leg: No edema.     Left lower leg: No edema.  Lymphadenopathy:     Cervical: No cervical adenopathy.  Skin:    General: Skin is warm and dry.  Neurological:     General: No focal deficit present.     Mental Status: He is alert and oriented to person, place, and time.     Cranial  Nerves: No cranial nerve deficit.  Psychiatric:        Mood and Affect: Mood normal.        Behavior: Behavior normal.           Assessment & Plan:

## 2023-05-20 NOTE — Assessment & Plan Note (Signed)
Chronic problem.  Well controlled on Lisinopril hydrochlorothiazide daily.  Check labs due to ACE and diuretic use but no anticipated med changes.  Will follow.

## 2023-05-20 NOTE — Assessment & Plan Note (Signed)
Deteriorated.  Pt has gained 9 lbs since Feb.  He has been eating well but admits to less exercise.  Will continue to follow.

## 2023-05-20 NOTE — Telephone Encounter (Signed)
Pt needs a PA

## 2023-05-20 NOTE — Telephone Encounter (Signed)
PA request has been Submitted. New Encounter created for follow up. For additional info see Pharmacy Prior Auth telephone encounter from 05/20/23.

## 2023-05-20 NOTE — Telephone Encounter (Signed)
Pharmacy Patient Advocate Encounter   Received notification from Pt Calls Messages that prior authorization for Dexcom G7 Sensor is required/requested.   Insurance verification completed.   The patient is insured through [facility] .   Per test claim: PA required; PA started via CoverMyMeds. KEY [id number] . Waiting for clinical questions to populate.

## 2023-05-20 NOTE — Patient Instructions (Signed)
Schedule your complete physical in 6 months We'll notify you of your lab results and make any changes if needed Continue to work on healthy diet and regular exercise- you can do it!!! You should be able to get your new sensor at the pharmacy Call with any questions or concerns Stay Safe!  Stay Healthy! Happy Fall!!!

## 2023-05-20 NOTE — Telephone Encounter (Signed)
Caller name: EUSEVIO SCHRIVER  On DPR?: Yes  Call back number: [phone] (mobile)  Provider they see: Sheliah Hatch, MD  Reason for call:   Pt needs PA for Dexcon sensor for DM

## 2023-05-20 NOTE — Assessment & Plan Note (Signed)
Chronic problem.  Pt is no longer seeing Endocrinology.  On Basaglar 24 units nightly and Metformin XR 500mg  BID.  On ARB for renal protection, due for microalbumin.  Currently asymptomatic.  Having difficulty w/ his [place] sensors.  Will switch to Dexcom.  Check labs.  Adjust meds prn

## 2023-05-21 ENCOUNTER — Telehealth: Payer: Self-pay

## 2023-05-21 LAB — CBC WITH DIFFERENTIAL/PLATELET
Basophils Absolute: 0.1 10*3/uL (ref 0.0–0.1)
Basophils Relative: 1.2 % (ref 0.0–3.0)
Eosinophils Absolute: 0.2 10*3/uL (ref 0.0–0.7)
Eosinophils Relative: 2 % (ref 0.0–5.0)
HCT: 49.4 % (ref 39.0–52.0)
Hemoglobin: 16 g/dL (ref 13.0–17.0)
Lymphocytes Relative: 40.9 % (ref 12.0–46.0)
Lymphs Abs: 3.3 10*3/uL (ref 0.7–4.0)
MCHC: 32.3 g/dL (ref 30.0–36.0)
MCV: 81.6 fL (ref 78.0–100.0)
Monocytes Absolute: 0.7 10*3/uL (ref 0.1–1.0)
Monocytes Relative: 8.5 % (ref 3.0–12.0)
Neutro Abs: 3.9 10*3/uL (ref 1.4–7.7)
Neutrophils Relative %: 47.4 % (ref 43.0–77.0)
Platelets: 285 10*3/uL (ref 150.0–400.0)
RBC: 6.05 Mil/uL — ABNORMAL HIGH (ref 4.22–5.81)
RDW: 14.2 % (ref 11.5–15.5)
WBC: 8.1 10*3/uL (ref 4.0–10.5)

## 2023-05-21 LAB — HEMOGLOBIN A1C: Hgb A1c MFr Bld: 8 % — ABNORMAL HIGH (ref 4.6–6.5)

## 2023-05-21 LAB — BASIC METABOLIC PANEL
BUN: 15 mg/dL (ref 6–23)
CO2: 29 meq/L (ref 19–32)
Calcium: 9.9 mg/dL (ref 8.4–10.5)
Chloride: 101 meq/L (ref 96–112)
Creatinine, Ser: 1.12 mg/dL (ref 0.40–1.50)
GFR: 71.61 mL/min (ref >60.00)
Glucose, Bld: 110 mg/dL — ABNORMAL HIGH (ref 70–99)
Potassium: 3.7 meq/L (ref 3.5–5.1)
Sodium: 140 meq/L (ref 135–145)

## 2023-05-21 LAB — LIPID PANEL
Cholesterol: 147 mg/dL (ref 0–200)
HDL: 68.4 mg/dL (ref >39.00)
LDL Cholesterol: 50 mg/dL (ref 0–99)
NonHDL: 78.61
Total CHOL/HDL Ratio: 2
Triglycerides: 145 mg/dL (ref 0.0–149.0)
VLDL: 29 mg/dL (ref 0.0–40.0)

## 2023-05-21 LAB — TSH: TSH: 0.99 u[IU]/mL (ref 0.35–5.50)

## 2023-05-21 LAB — HIV ANTIBODY (ROUTINE TESTING W REFLEX): HIV 1&2 Ab, 4th Generation: NONREACTIVE

## 2023-05-21 LAB — HEPATIC FUNCTION PANEL
ALT: 48 U/L (ref 0–53)
AST: 39 U/L — ABNORMAL HIGH (ref 0–37)
Albumin: 4.5 g/dL (ref 3.5–5.2)
Alkaline Phosphatase: 72 U/L (ref 39–117)
Bilirubin, Direct: 0.2 mg/dL (ref 0.0–0.3)
Total Bilirubin: 1.3 mg/dL — ABNORMAL HIGH (ref 0.2–1.2)
Total Protein: 7.7 g/dL (ref 6.0–8.3)

## 2023-05-21 LAB — HEPATITIS C ANTIBODY: Hepatitis C Ab: NONREACTIVE

## 2023-05-21 NOTE — Telephone Encounter (Signed)
Patient returned call, I informed him of his lab results and scheduled him a 3 month follow up

## 2023-05-21 NOTE — Telephone Encounter (Signed)
Clinical questions answered. PA submitted

## 2023-05-21 NOTE — Telephone Encounter (Signed)
-----   Message from Neena Rhymes sent at 05/21/2023  3:23 PM EST ----- A1C has jumped up from 7.2 --> 8%  This indicates worsening sugar control.  Please be mindful of a low carb/low sugar diet and regular exercise.  We won't make any medication adjustments at this time but we will need to follow up in 3 months to recheck sugars and see if diet and exercise have worked.  Remainder of labs are stable and look good!

## 2023-05-21 NOTE — Telephone Encounter (Signed)
Noted  

## 2023-05-22 ENCOUNTER — Other Ambulatory Visit (HOSPITAL_COMMUNITY): Payer: Self-pay

## 2023-05-22 NOTE — Telephone Encounter (Signed)
Pharmacy Patient Advocate Encounter  Received notification from [facility] that Prior Authorization for Dexcom G7 Sensor has been APPROVED from 05/22/23 to 05/20/24. Ran test claim, Copay is $0. This test claim was processed through [facility]- copay amounts may vary at other pharmacies due to pharmacy/plan contracts, or as the patient moves through the different stages of their insurance plan.   PA #/Case ID/Reference #: [id number]

## 2023-05-22 NOTE — Telephone Encounter (Signed)
LM to call back or call the pharmacy about Rx

## 2023-07-06 ENCOUNTER — Other Ambulatory Visit: Payer: Self-pay | Admitting: Family Medicine

## 2023-07-06 DIAGNOSIS — E1165 Type 2 diabetes mellitus with hyperglycemia: Secondary | ICD-10-CM

## 2023-07-10 NOTE — Telephone Encounter (Signed)
error 

## 2023-08-01 ENCOUNTER — Other Ambulatory Visit: Payer: Self-pay

## 2023-08-01 ENCOUNTER — Emergency Department (HOSPITAL_BASED_OUTPATIENT_CLINIC_OR_DEPARTMENT_OTHER): Payer: BC Managed Care – PPO

## 2023-08-01 ENCOUNTER — Encounter (HOSPITAL_BASED_OUTPATIENT_CLINIC_OR_DEPARTMENT_OTHER): Payer: Self-pay | Admitting: Emergency Medicine

## 2023-08-01 ENCOUNTER — Emergency Department (HOSPITAL_BASED_OUTPATIENT_CLINIC_OR_DEPARTMENT_OTHER)
Admission: EM | Admit: 2023-08-01 | Discharge: 2023-08-01 | Disposition: A | Discharge status: Home / Self Care | Payer: BC Managed Care – PPO | Attending: Emergency Medicine | Admitting: Emergency Medicine

## 2023-08-01 DIAGNOSIS — Z7984 Long term (current) use of oral hypoglycemic drugs: Secondary | ICD-10-CM | POA: Insufficient documentation

## 2023-08-01 DIAGNOSIS — M25462 Effusion, left knee: Secondary | ICD-10-CM | POA: Diagnosis not present

## 2023-08-01 DIAGNOSIS — Z794 Long term (current) use of insulin: Secondary | ICD-10-CM | POA: Insufficient documentation

## 2023-08-01 DIAGNOSIS — M25562 Pain in left knee: Secondary | ICD-10-CM | POA: Diagnosis not present

## 2023-08-01 DIAGNOSIS — S8992XD Unspecified injury of left lower leg, subsequent encounter: Secondary | ICD-10-CM | POA: Diagnosis not present

## 2023-08-01 DIAGNOSIS — M1712 Unilateral primary osteoarthritis, left knee: Secondary | ICD-10-CM | POA: Diagnosis not present

## 2023-08-01 DIAGNOSIS — S83242A Other tear of medial meniscus, current injury, left knee, initial encounter: Secondary | ICD-10-CM | POA: Diagnosis not present

## 2023-08-01 MED ORDER — KETOROLAC TROMETHAMINE 15 MG/ML IJ SOLN
15.0000 mg | Freq: Once | INTRAMUSCULAR | Status: AC
  Administered 2023-08-01: 15 mg via INTRAMUSCULAR
  Filled 2023-08-01: qty 1

## 2023-08-01 NOTE — ED Provider Notes (Signed)
[place] [facility] Provider Note   CSN: [id number] Arrival date & time: 08/01/23  1439     History  Chief Complaint  Patient presents with   Knee Pain    Shaheem A Woolworth is a 63 y.o. male.  63 yo M with a chief complaint of left knee pain.  This been going on for about 3 weeks.  He said he stepped off of his porch and felt like he twisted funny.  Has had pain and swelling since.  Denies any overt trauma otherwise.  Denies fevers.  Denies problems in that knee before.  He has tried ice off-and-on as well as ibuprofen with minimal improvement.   Knee Pain      Home Medications Prior to Admission medications   Medication Sig Start Date End Date Taking? Authorizing Provider  Continuous Glucose Sensor (DEXCOM G7 SENSOR) MISC Apply sensor every 10 days to check sugars as directed 05/20/23   Sheliah Hatch, MD  Insulin Glargine [facility]) 100 UNIT/ML Inject 24 Units into the skin at bedtime. 05/18/23   Sheliah Hatch, MD  Insulin Pen Needle (PENTIPS) 32G X 4 MM MISC 1 Needle by Does not apply route in the morning, at noon, and at bedtime. 05/20/23   Sheliah Hatch, MD  Insulin Pen Needle 32G X 4 MM MISC 1 Device by Does not apply route daily in the afternoon. 10/02/21   Shamleffer, Konrad Dolores, MD  losartan-hydrochlorothiazide (HYZAAR) 100-25 MG tablet Take 1 tablet by mouth daily. 04/29/23   Sheliah Hatch, MD  metFORMIN (GLUCOPHAGE-XR) 500 MG 24 hr tablet TAKE 1 TABLET(500 MG) BY MOUTH TWICE DAILY WITH A MEAL 07/07/23   Sheliah Hatch, MD  Multiple Vitamins-Minerals (MULTIVITAMIN WITH MINERALS) tablet Take 1 tablet by mouth daily.    [provider]  cetirizine (ZYRTEC) 10 MG tablet Take 1 tablet (10 mg total) by mouth daily. 12/14/19 11/01/20  Sheliah Hatch, MD  fluticasone (FLONASE) 50 MCG/ACT nasal spray Place 2 sprays into both nostrils daily. 12/14/19 11/01/20  Sheliah Hatch, MD       Allergies    Patient has no known allergies.    Review of Systems   Review of Systems  Physical Exam Updated Vital Signs BP (!) 141/90   Pulse 79   Temp 98.5 F (36.9 C)   Resp 16   Ht 5\' 8"  (1.727 m)   Wt 91.6 kg   SpO2 95%   BMI 30.71 kg/m  Physical Exam Vitals and nursing note reviewed.  Constitutional:      Appearance: He is well-developed.  HENT:     Head: Normocephalic and atraumatic.  Eyes:     Pupils: Pupils are equal, round, and reactive to light.  Neck:     Vascular: No JVD.  Cardiovascular:     Rate and Rhythm: Normal rate and regular rhythm.     Heart sounds: No murmur heard.    No friction rub. No gallop.  Pulmonary:     Effort: No respiratory distress.     Breath sounds: No wheezing.  Abdominal:     General: There is no distension.     Tenderness: There is no abdominal tenderness. There is no guarding or rebound.  Musculoskeletal:        General: Normal range of motion.     Cervical back: Normal range of motion and neck supple.     Comments: No significant edema.  Has some pain with range  of motion but I am able to range it without significant issue.  No obvious ligamentous laxity.  Negative McMurray's test.  Pulse motor and sensation intact distally.  Skin:    Coloration: Skin is not pale.     Findings: No rash.  Neurological:     Mental Status: He is alert and oriented to person, place, and time.  Psychiatric:        Behavior: Behavior normal.     ED Results / Procedures / Treatments   Labs (all labs ordered are listed, but only abnormal results are displayed) Labs Reviewed - No data to display  EKG None  Radiology No results found.  Procedures Procedures    Medications Ordered in ED Medications  ketorolac (TORADOL) 15 MG/ML injection 15 mg (has no administration in time range)    ED Course/ Medical Decision Making/ A&P                                 Medical Decision Making Amount and/or Complexity of Data  Reviewed Radiology: ordered.  Risk Prescription drug management.   63 yo M with a chief complaints of left knee pain.  Going on for about 3 weeks.  By history it sounds like a meniscal injury.  He said he stepped down off of a porch and felt he twisted funny and since then has had pain and swelling.  I do not appreciate any obvious discomfort with McMurray's.  Will immobilize.  Crutches.  PCP follow-up.  Plain film of the left knee independently interpreted by me without acute fracture or dislocation.  4:02 PM:  I have discussed the diagnosis/risks/treatment options with the patient and family.  Evaluation and diagnostic testing in the emergency department does not suggest an emergent condition requiring admission or immediate intervention beyond what has been performed at this time.  They will follow up with PCP, sports med. We also discussed returning to the ED immediately if new or worsening sx occur. We discussed the sx which are most concerning (e.g., sudden worsening pain, fever, inability to tolerate by mouth) that necessitate immediate return. Medications administered to the patient during their visit and any new prescriptions provided to the patient are listed below.  Medications given during this visit Medications  ketorolac (TORADOL) 15 MG/ML injection 15 mg (has no administration in time range)     The patient appears reasonably screen and/or stabilized for discharge and I doubt any other medical condition or other [facility] requiring further screening, evaluation, or treatment in the ED at this time prior to discharge.          Final Clinical Impression(s) / ED Diagnoses Final diagnoses:  Acute pain of left knee    Rx / DC Orders ED Discharge Orders     None         Melene Plan, DO 08/01/23 1602

## 2023-08-01 NOTE — ED Triage Notes (Signed)
Pt twisted LT knee about 3 wks ago; c/o continued swelling

## 2023-08-01 NOTE — Discharge Instructions (Signed)
Try to keep your weight off of the leg as best he can.  Please follow-up with your family doctor in the office.  I have given you information for the sports medicine clinic if you would reach out to them.  Max dosing of the over-the-counter medications below.  Take 4 over the counter ibuprofen tablets 3 times a day or 2 over-the-counter naproxen tablets twice a day for pain. Also take tylenol 1000mg (2 extra strength) four times a day.

## 2023-08-21 ENCOUNTER — Ambulatory Visit (INDEPENDENT_AMBULATORY_CARE_PROVIDER_SITE_OTHER): Payer: BC Managed Care – PPO | Admitting: Family Medicine

## 2023-08-21 ENCOUNTER — Encounter: Payer: Self-pay | Admitting: Family Medicine

## 2023-08-21 VITALS — BP 128/68 | HR 72 | Temp 99.0°F | Ht 68.0 in | Wt 205.4 lb

## 2023-08-21 DIAGNOSIS — Z794 Long term (current) use of insulin: Secondary | ICD-10-CM | POA: Diagnosis not present

## 2023-08-21 DIAGNOSIS — E119 Type 2 diabetes mellitus without complications: Secondary | ICD-10-CM

## 2023-08-21 DIAGNOSIS — Z0000 Encounter for general adult medical examination without abnormal findings: Secondary | ICD-10-CM

## 2023-08-21 DIAGNOSIS — Z125 Encounter for screening for malignant neoplasm of prostate: Secondary | ICD-10-CM | POA: Diagnosis not present

## 2023-08-21 LAB — BASIC METABOLIC PANEL
BUN: 17 mg/dL (ref 6–23)
CO2: 29 meq/L (ref 19–32)
Calcium: 9.7 mg/dL (ref 8.4–10.5)
Chloride: 102 meq/L (ref 96–112)
Creatinine, Ser: 1.08 mg/dL (ref 0.40–1.50)
GFR: 74.67 mL/min (ref >60.00)
Glucose, Bld: 133 mg/dL — ABNORMAL HIGH (ref 70–99)
Potassium: 4.2 meq/L (ref 3.5–5.1)
Sodium: 142 meq/L (ref 135–145)

## 2023-08-21 LAB — CBC WITH DIFFERENTIAL/PLATELET
Basophils Absolute: 0 10*3/uL (ref 0.0–0.1)
Basophils Relative: 0.6 % (ref 0.0–3.0)
Eosinophils Absolute: 0.2 10*3/uL (ref 0.0–0.7)
Eosinophils Relative: 2.1 % (ref 0.0–5.0)
HCT: 48.5 % (ref 39.0–52.0)
Hemoglobin: 15.5 g/dL (ref 13.0–17.0)
Lymphocytes Relative: 33.8 % (ref 12.0–46.0)
Lymphs Abs: 2.7 10*3/uL (ref 0.7–4.0)
MCHC: 31.9 g/dL (ref 30.0–36.0)
MCV: 80.8 fL (ref 78.0–100.0)
Monocytes Absolute: 0.7 10*3/uL (ref 0.1–1.0)
Monocytes Relative: 8.7 % (ref 3.0–12.0)
Neutro Abs: 4.3 10*3/uL (ref 1.4–7.7)
Neutrophils Relative %: 54.8 % (ref 43.0–77.0)
Platelets: 288 10*3/uL (ref 150.0–400.0)
RBC: 6 Mil/uL — ABNORMAL HIGH (ref 4.22–5.81)
RDW: 14 % (ref 11.5–15.5)
WBC: 7.9 10*3/uL (ref 4.0–10.5)

## 2023-08-21 LAB — HEPATIC FUNCTION PANEL
ALT: 28 U/L (ref 0–53)
AST: 20 U/L (ref 0–37)
Albumin: 4.5 g/dL (ref 3.5–5.2)
Alkaline Phosphatase: 80 U/L (ref 39–117)
Bilirubin, Direct: 0.2 mg/dL (ref 0.0–0.3)
Total Bilirubin: 0.9 mg/dL (ref 0.2–1.2)
Total Protein: 7.2 g/dL (ref 6.0–8.3)

## 2023-08-21 LAB — LIPID PANEL
Cholesterol: 147 mg/dL (ref 0–200)
HDL: 73.8 mg/dL (ref >39.00)
LDL Cholesterol: 38 mg/dL (ref 0–99)
NonHDL: 73.02
Total CHOL/HDL Ratio: 2
Triglycerides: 175 mg/dL — ABNORMAL HIGH (ref 0.0–149.0)
VLDL: 35 mg/dL (ref 0.0–40.0)

## 2023-08-21 LAB — HEMOGLOBIN A1C: Hgb A1c MFr Bld: 7.5 % — ABNORMAL HIGH (ref 4.6–6.5)

## 2023-08-21 LAB — TSH: TSH: 1.06 u[IU]/mL (ref 0.35–5.50)

## 2023-08-21 LAB — PSA: PSA: 0.76 ng/mL (ref 0.10–4.00)

## 2023-08-21 MED ORDER — TRAZODONE HCL 50 MG PO TABS: 25.0000 mg | ORAL_TABLET | Freq: Every evening | ORAL | 3 refills | Status: AC | PRN

## 2023-08-21 NOTE — Patient Instructions (Addendum)
 Follow up in 3-4 months to recheck sugar We'll notify you of your lab results and make any changes if needed Continue to work on healthy diet and regular exercise- you're doing great! START the Trazodone  nightly for sleep.  Start w/ 1/2 tab and increase to 1 tab if needed Call with any questions or concerns Stay Safe!  Stay Healthy! Hang in there!!!

## 2023-08-21 NOTE — Assessment & Plan Note (Signed)
 Pt's PE WNL w/ exception of BMI.  UTD on colonoscopy, Tdap, eye exam.  Check labs.  Anticipatory guidance provided.

## 2023-08-21 NOTE — Progress Notes (Signed)
   Subjective:    Patient ID: Lee Greene , male    DOB: 09/09/1960, 63 y.o.   MRN: [id number]  HPI CPE- UTD on Tdap, colonoscopy, foot exam, microalbumin.  Patient Care Team    Relationship Specialty Notifications Start End  Mahlon Comer BRAVO, MD PCP - General Family Medicine  08/05/22   [facility]    08/14/22     Health Maintenance  Topic Date Due   Pneumococcal Vaccine 39-78 Years old (1 of 2 - PCV) Never done   Zoster Vaccines- Shingrix (1 of 2) Never done   INFLUENZA VACCINE  02/12/2023   COVID-19 Vaccine (3 - 2024-25 season) 03/15/2023   OPHTHALMOLOGY EXAM  05/02/2023   HEMOGLOBIN A1C  11/17/2023   Diabetic kidney evaluation - eGFR measurement  05/19/2024   Diabetic kidney evaluation - Urine ACR  05/19/2024   FOOT EXAM  05/19/2024   Colonoscopy  10/01/2025   DTaP/Tdap/Td (2 - Td or Tdap) 11/02/2030   Hepatitis C Screening  Completed   HIV Screening  Completed   HPV VACCINES  Aged Out      Review of Systems Patient reports no vision/hearing changes, anorexia, fever ,adenopathy, persistant/recurrent hoarseness, swallowing issues, chest pain, palpitations, edema, persistant/recurrent cough, hemoptysis, dyspnea (rest,exertional, paroxysmal nocturnal), gastrointestinal  bleeding (melena, rectal bleeding), abdominal pain, excessive heart burn, GU symptoms (dysuria, hematuria, voiding/incontinence issues) syncope, focal weakness, memory loss, numbness & tingling, skin/hair/nail changes, depression, anxiety, abnormal bruising/bleeding, musculoskeletal symptoms/signs.     Objective:   Physical Exam General Appearance:    Alert, cooperative, no distress, appears stated age, obese  Head:    Normocephalic, without obvious abnormality, atraumatic  Eyes:    PERRL, conjunctiva/corneas clear, EOM's intact both eyes       Ears:    Normal TM's and external ear canals, both ears  Nose:   Nares normal, septum midline, mucosa normal, no drainage   or sinus tenderness  Throat:    Lips, mucosa, and tongue normal; teeth and gums normal  Neck:   Supple, symmetrical, trachea midline, no adenopathy;       thyroid :  No enlargement/tenderness/nodules  Back:     Symmetric, no curvature, ROM normal, no CVA tenderness  Lungs:     Clear to auscultation bilaterally, respirations unlabored  Chest wall:    No tenderness or deformity  Heart:    Regular rate and rhythm, S1 and S2 normal, no murmur, rub   or gallop  Abdomen:     Soft, non-tender, bowel sounds active all four quadrants,    no masses, no organomegaly  Genitalia:    Normal male without lesion, masses,discharge or tenderness  Rectal:    Deferred due to young age  Extremities:   Extremities normal, atraumatic, no cyanosis or edema  Pulses:   2+ and symmetric all extremities  Skin:   Skin color, texture, turgor normal, no rashes or lesions  Lymph nodes:   Cervical, supraclavicular, and axillary nodes normal  Neurologic:   CNII-XII intact. Normal strength, sensation and reflexes      throughout          Assessment & Plan:

## 2023-08-21 NOTE — Assessment & Plan Note (Signed)
Chronic problem.  UTD on eye exam, foot exam, microalbumin.  Check labs.  Adjust meds prn  

## 2023-08-23 ENCOUNTER — Encounter: Payer: Self-pay | Admitting: Family Medicine

## 2023-08-24 ENCOUNTER — Telehealth: Payer: Self-pay

## 2023-08-24 NOTE — Telephone Encounter (Signed)
-----   Message from Laymon Priest sent at 08/23/2023  4:56 PM EST ----- Labs look great!  A1C is better!!  No changes at this time

## 2023-08-24 NOTE — Telephone Encounter (Signed)
 Pt has reviewed and response back. Pt will work on some changes.

## 2023-09-01 ENCOUNTER — Other Ambulatory Visit: Payer: Self-pay

## 2023-09-01 ENCOUNTER — Other Ambulatory Visit: Payer: Self-pay | Admitting: Family Medicine

## 2023-09-01 DIAGNOSIS — I10 Essential (primary) hypertension: Secondary | ICD-10-CM

## 2023-09-01 DIAGNOSIS — E1165 Type 2 diabetes mellitus with hyperglycemia: Secondary | ICD-10-CM

## 2023-09-01 MED ORDER — LOSARTAN POTASSIUM-HCTZ 100-25 MG PO TABS
1.0000 | ORAL_TABLET | Freq: Every day | ORAL | 0 refills | Status: DC
Start: 2023-09-01 — End: 2023-12-14

## 2023-10-20 ENCOUNTER — Other Ambulatory Visit: Payer: Self-pay

## 2023-10-20 ENCOUNTER — Other Ambulatory Visit: Payer: Self-pay | Admitting: Family Medicine

## 2023-10-20 DIAGNOSIS — E1165 Type 2 diabetes mellitus with hyperglycemia: Secondary | ICD-10-CM

## 2023-10-20 MED ORDER — METFORMIN HCL ER 500 MG PO TB24: 500.0000 mg | ORAL_TABLET | Freq: Two times a day (BID) | ORAL | 0 refills | Status: DC

## 2023-11-04 ENCOUNTER — Other Ambulatory Visit: Payer: Self-pay | Admitting: Family Medicine

## 2023-12-14 ENCOUNTER — Other Ambulatory Visit: Payer: Self-pay

## 2023-12-14 DIAGNOSIS — I10 Essential (primary) hypertension: Secondary | ICD-10-CM

## 2023-12-14 MED ORDER — LOSARTAN POTASSIUM-HCTZ 100-25 MG PO TABS: 1.0000 | ORAL_TABLET | Freq: Every day | ORAL | 0 refills | Status: DC

## 2023-12-16 ENCOUNTER — Other Ambulatory Visit: Payer: Self-pay | Admitting: Family Medicine

## 2023-12-16 MED ORDER — FREESTYLE LIBRE 3 SENSOR MISC: 0 refills | Status: AC

## 2023-12-16 NOTE — Telephone Encounter (Signed)
 Copied from CRM [id number]. Topic: Clinical - Medication Refill >> Dec 16, 2023 12:43 PM Allyne Areola wrote: Medication: Continuous Glucose Sensor (FREESTYLE LIBRE 3 SENSOR) MISC   Has the patient contacted their pharmacy? Yes, they will be faxing a request to the office but asked patient to contact the primary care office.  (Agent: If no, request that the patient contact the pharmacy for the refill. If patient does not wish to contact the pharmacy document the reason why and proceed with request.) (Agent: If yes, when and what did the pharmacy advise?)  This is the patient's preferred pharmacy:  [facility] [address], [place] - [address] AT [address] [place] [address] Phone: [phone] Fax: [phone]  Is this the correct pharmacy for this prescription? Yes If no, delete pharmacy and type the correct one.   Has the prescription been filled recently? No  Is the patient out of the medication? Yes  Has the patient been seen for an appointment in the last year OR does the patient have an upcoming appointment? Yes  Can we respond through MyChart? Yes  Agent: Please be advised that Rx refills may take up to 3 business days. We ask that you follow-up with your pharmacy.

## 2023-12-17 ENCOUNTER — Telehealth: Payer: Self-pay

## 2023-12-17 MED ORDER — FREESTYLE LIBRE 3 PLUS SENSOR MISC: 2 refills | Status: DC

## 2023-12-17 NOTE — Telephone Encounter (Signed)
 Copied from CRM [id number]. Topic: Clinical - Prescription Issue >> Dec 17, 2023  8:46 AM Martinique E wrote: Reason for CRM: Patient went in to pick up his Continuous Glucose Sensor (FREESTYLE LIBRE 3 SENSOR) MISC and the pharmacy stated it was discontinued. Patient requesting a new order for "Freestyle Libre 3 Plus." Callback number for patient is [phone].

## 2023-12-17 NOTE — Telephone Encounter (Signed)
 Rx has been changed for the patient and sent to the pharmacy, called and informed patient of this being completed and he was appreciative

## 2024-01-01 ENCOUNTER — Other Ambulatory Visit: Payer: Self-pay | Admitting: Family Medicine

## 2024-01-18 ENCOUNTER — Other Ambulatory Visit: Payer: Self-pay

## 2024-01-18 DIAGNOSIS — E1165 Type 2 diabetes mellitus with hyperglycemia: Secondary | ICD-10-CM

## 2024-01-18 MED ORDER — METFORMIN HCL ER 500 MG PO TB24
500.0000 mg | ORAL_TABLET | Freq: Two times a day (BID) | ORAL | 0 refills | Status: DC
Start: 2024-01-18 — End: 2024-05-03

## 2024-03-30 ENCOUNTER — Other Ambulatory Visit: Payer: Self-pay

## 2024-03-30 DIAGNOSIS — I10 Essential (primary) hypertension: Secondary | ICD-10-CM

## 2024-03-30 MED ORDER — LOSARTAN POTASSIUM-HCTZ 100-25 MG PO TABS: 1.0000 | ORAL_TABLET | Freq: Every day | ORAL | 0 refills | Status: DC

## 2024-04-27 ENCOUNTER — Encounter: Payer: Self-pay | Admitting: Family Medicine

## 2024-04-29 MED ORDER — ONETOUCH VERIO VI STRP: ORAL_STRIP | 12 refills | Status: DC

## 2024-04-29 NOTE — Addendum Note (Signed)
 Addended by: Timothy Trudell E on: 04/29/2024 03:43 PM   Modules accepted: Orders

## 2024-04-29 NOTE — Progress Notes (Signed)
 Lee Greene                                           MRN: [id number]   04/29/2024   The [facility] Quality Team Specialist reviewed this patient medical record for the purposes of chart review for care gap closure. The following were reviewed: chart review for care gap closure-kidney health evaluation for diabetes:eGFR  and uACR.    [facility] Quality Team

## 2024-04-29 NOTE — Telephone Encounter (Signed)
 Patient replied

## 2024-05-03 ENCOUNTER — Other Ambulatory Visit: Payer: Self-pay | Admitting: Family Medicine

## 2024-05-03 DIAGNOSIS — E1165 Type 2 diabetes mellitus with hyperglycemia: Secondary | ICD-10-CM

## 2024-05-04 ENCOUNTER — Other Ambulatory Visit: Payer: Self-pay | Admitting: Family Medicine

## 2024-05-04 NOTE — Telephone Encounter (Signed)
 Pt needs appt.

## 2024-05-04 NOTE — Telephone Encounter (Signed)
 Called to make an appointment (last OV 08/21/23 and requested follow up in 3-4 months to recheck sugar at that time.  No answer, LM to call back

## 2024-05-05 NOTE — Telephone Encounter (Signed)
 Called and spoke with patient and got him scheduled. Refill sent

## 2024-05-30 ENCOUNTER — Ambulatory Visit (INDEPENDENT_AMBULATORY_CARE_PROVIDER_SITE_OTHER): Admitting: Family Medicine

## 2024-05-30 VITALS — BP 116/80 | HR 79 | Temp 98.9°F | Ht 68.0 in | Wt 211.0 lb

## 2024-05-30 DIAGNOSIS — I10 Essential (primary) hypertension: Secondary | ICD-10-CM | POA: Diagnosis not present

## 2024-05-30 DIAGNOSIS — E119 Type 2 diabetes mellitus without complications: Secondary | ICD-10-CM

## 2024-05-30 DIAGNOSIS — Z794 Long term (current) use of insulin: Secondary | ICD-10-CM

## 2024-05-30 DIAGNOSIS — E669 Obesity, unspecified: Secondary | ICD-10-CM

## 2024-05-30 DIAGNOSIS — E1165 Type 2 diabetes mellitus with hyperglycemia: Secondary | ICD-10-CM | POA: Diagnosis not present

## 2024-05-30 DIAGNOSIS — G4700 Insomnia, unspecified: Secondary | ICD-10-CM | POA: Insufficient documentation

## 2024-05-30 LAB — CBC WITH DIFFERENTIAL/PLATELET
Basophils Absolute: 0.1 K/uL (ref 0.0–0.1)
Basophils Relative: 0.6 % (ref 0.0–3.0)
Eosinophils Absolute: 0.2 K/uL (ref 0.0–0.7)
Eosinophils Relative: 1.9 % (ref 0.0–5.0)
HCT: 46.1 % (ref 39.0–52.0)
Hemoglobin: 15.1 g/dL (ref 13.0–17.0)
Lymphocytes Relative: 35 % (ref 12.0–46.0)
Lymphs Abs: 2.9 K/uL (ref 0.7–4.0)
MCHC: 32.8 g/dL (ref 30.0–36.0)
MCV: 80.7 fl (ref 78.0–100.0)
Monocytes Absolute: 0.8 K/uL (ref 0.1–1.0)
Monocytes Relative: 9.7 % (ref 3.0–12.0)
Neutro Abs: 4.4 K/uL (ref 1.4–7.7)
Neutrophils Relative %: 52.8 % (ref 43.0–77.0)
Platelets: 261 K/uL (ref 150.0–400.0)
RBC: 5.71 Mil/uL (ref 4.22–5.81)
RDW: 14.1 % (ref 11.5–15.5)
WBC: 8.4 K/uL (ref 4.0–10.5)

## 2024-05-30 LAB — TSH: TSH: 1.48 u[IU]/mL (ref 0.35–5.50)

## 2024-05-30 MED ORDER — ONETOUCH VERIO VI STRP: ORAL_STRIP | 12 refills | Status: AC

## 2024-05-30 NOTE — Assessment & Plan Note (Signed)
 Ongoing issue.  Continue Trazodone 

## 2024-05-30 NOTE — Assessment & Plan Note (Signed)
 Deteriorated.  Pt has gained 6 lbs since last visit.  BMI now 32.08.  encouraged low carb diet and regular exercise.  Check labs to risk stratify.  Will follow.

## 2024-05-30 NOTE — Assessment & Plan Note (Signed)
 Chronic problem.  On Metformin , Basaglar  w/o difficulty.  Foot exam done today.  Microalbumin ordered.  Will get eye exam report.  Check labs.  Adjust meds prn.

## 2024-05-30 NOTE — Assessment & Plan Note (Signed)
 Chronic problem.  On Losartan  hydrochlorothiazide  w/ good control.  Currently asymptomatic.  Check labs due to ARB and diuretic but no anticipated med changes.

## 2024-05-30 NOTE — Progress Notes (Signed)
   Subjective:    Patient ID: Lee Greene , male    DOB: 1961/03/05, 63 y.o.   MRN: [id number]  HPI DM- chronic problem, on Metformin  XR 500mg  BID, Basaglar  24 units at bedtime.  Last A1C 7.5%.  Due for foot exam, microalbumin.  No abd pain, N/V.  No numbness/tingling of hands/feet.    HTN- chronic problem.  On Losartan  hydrochlorothiazide  100/25mg  daily w/ good control.  No CP, SOB, HA's, visual changes, edema.  Obesity- ongoing issue.  Pt has gained 6 lbs since Feb  Insomnia- ongoing issue.  On Trazodone  50mg  daily.   Review of Systems For ROS see HPI     Objective:   Physical Exam Vitals reviewed.  Constitutional:      General: He is not in acute distress.    Appearance: Normal appearance. He is well-developed. He is obese. He is not ill-appearing.  HENT:     Head: Normocephalic and atraumatic.  Eyes:     Extraocular Movements: Extraocular movements intact.     Conjunctiva/sclera: Conjunctivae normal.     Pupils: Pupils are equal, round, and reactive to light.  Neck:     Thyroid : No thyromegaly.  Cardiovascular:     Rate and Rhythm: Normal rate and regular rhythm.     Pulses: Normal pulses.     Heart sounds: Normal heart sounds. No murmur heard. Pulmonary:     Effort: Pulmonary effort is normal. No respiratory distress.     Breath sounds: Normal breath sounds.  Abdominal:     General: Bowel sounds are normal. There is no distension.     Palpations: Abdomen is soft.  Musculoskeletal:     Cervical back: Normal range of motion and neck supple.     Right lower leg: No edema.     Left lower leg: No edema.  Lymphadenopathy:     Cervical: No cervical adenopathy.  Skin:    General: Skin is warm and dry.  Neurological:     General: No focal deficit present.     Mental Status: He is alert and oriented to person, place, and time.     Cranial Nerves: No cranial nerve deficit.  Psychiatric:        Mood and Affect: Mood normal.        Behavior: Behavior normal.            Assessment & Plan:

## 2024-05-30 NOTE — Patient Instructions (Signed)
 Schedule your complete physical in Feb We'll notify you of your lab results and make any changes if needed Continue to work on healthy diet and regular exercise- you can do it! Call with any questions or concerns Stay Safe!  Stay Healthy! Happy Holidays!

## 2024-05-31 LAB — LIPID PANEL
Cholesterol: 145 mg/dL (ref 0–200)
HDL: 71.5 mg/dL (ref >39.00)
LDL Cholesterol: 48 mg/dL (ref 0–99)
NonHDL: 73.51
Total CHOL/HDL Ratio: 2
Triglycerides: 130 mg/dL (ref 0.0–149.0)
VLDL: 26 mg/dL (ref 0.0–40.0)

## 2024-05-31 LAB — HEPATIC FUNCTION PANEL
ALT: 37 U/L (ref 0–53)
AST: 26 U/L (ref 0–37)
Albumin: 4.3 g/dL (ref 3.5–5.2)
Alkaline Phosphatase: 74 U/L (ref 39–117)
Bilirubin, Direct: 0.1 mg/dL (ref 0.0–0.3)
Total Bilirubin: 0.8 mg/dL (ref 0.2–1.2)
Total Protein: 7.4 g/dL (ref 6.0–8.3)

## 2024-05-31 LAB — BASIC METABOLIC PANEL WITH GFR
BUN: 16 mg/dL (ref 6–23)
CO2: 24 meq/L (ref 19–32)
Calcium: 9.4 mg/dL (ref 8.4–10.5)
Chloride: 102 meq/L (ref 96–112)
Creatinine, Ser: 1.09 mg/dL (ref 0.40–1.50)
GFR: 73.45 mL/min (ref >60.00)
Glucose, Bld: 200 mg/dL — ABNORMAL HIGH (ref 70–99)
Potassium: 4.1 meq/L (ref 3.5–5.1)
Sodium: 140 meq/L (ref 135–145)

## 2024-05-31 LAB — HEMOGLOBIN A1C: Hgb A1c MFr Bld: 9.3 % — ABNORMAL HIGH (ref 4.6–6.5)

## 2024-06-07 ENCOUNTER — Ambulatory Visit: Payer: Self-pay | Admitting: Family Medicine

## 2024-06-07 MED ORDER — BASAGLAR KWIKPEN 100 UNIT/ML ~~LOC~~ SOPN
30.0000 [IU] | PEN_INJECTOR | Freq: Every day | SUBCUTANEOUS | 1 refills | Status: AC
Start: 2024-06-07 — End: ?

## 2024-06-07 NOTE — Progress Notes (Signed)
 Pt has reviewed labs via MyChart

## 2024-06-07 NOTE — Progress Notes (Signed)
 LVM to call office or Via MyChart

## 2024-07-10 ENCOUNTER — Other Ambulatory Visit: Payer: Self-pay | Admitting: Family Medicine

## 2024-07-10 DIAGNOSIS — I10 Essential (primary) hypertension: Secondary | ICD-10-CM

## 2024-07-12 ENCOUNTER — Ambulatory Visit: Payer: Self-pay

## 2024-07-12 NOTE — Telephone Encounter (Signed)
 FYI Only or Action Required?: Action required by provider: request for appointment, clinical question for provider, and update on patient condition.  Patient was last seen in primary care on 05/30/2024 by Mahlon Comer BRAVO, MD.  Called Nurse Triage reporting Cough.  Symptoms began several weeks ago.  Interventions attempted: Rest, hydration, or home remedies.  Symptoms are: unchanged.  Triage Disposition: See HCP Within 4 Hours (Or PCP Triage)  Patient/caregiver understands and will follow disposition?: No, wishes to speak with PCP  Copied from CRM #[id number]. Topic: Clinical - Red Word Triage >> Jul 12, 2024 10:54 AM Jayma L wrote: Red Word that prompted transfer to Nurse Triage: Flu feeling sick  - coughing really hard and getting light headed. Dry cough right now , headache and body aches , cant eat Reason for Disposition  [1] MILD difficulty breathing (e.g., minimal/no SOB at rest, SOB with walking, pulse < 100) AND [2] still present when not coughing  Answer Assessment - Initial Assessment Questions Patient reports dry cough with headache and body aches. Wants to only see Dr. Mahlon  1. ONSET: When did the cough begin?      Started a month ago 2. SEVERITY: How bad is the cough today?      mild 3. SPUTUM: Describe the color of your sputum (e.g., none, dry cough; clear, white, yellow, green)     dry 4. HEMOPTYSIS: Are you coughing up any blood? If Yes, ask: How much? (e.g., flecks, streaks, tablespoons, etc.)     no 5. DIFFICULTY BREATHING: Are you having difficulty breathing? If Yes, ask: How bad is it? (e.g., mild, moderate, severe)      mild 6. FEVER: Do you have a fever? If Yes, ask: What is your temperature, how was it measured, and when did it start?     no 7. CARDIAC HISTORY: Do you have any history of heart disease? (e.g., heart attack, congestive heart failure)      no 8. LUNG HISTORY: Do you have any history of lung disease?  (e.g., pulmonary  embolus, asthma, emphysema)     no 9. PE RISK FACTORS: Do you have a history of blood clots? (or: recent major surgery, recent prolonged travel, bedridden)     no 10. OTHER SYMPTOMS: Do you have any other symptoms? (e.g., runny nose, wheezing, chest pain)       Body aches, headache 12. TRAVEL: Have you traveled out of the country in the last month? (e.g., travel history, exposures)       no  Protocols used: Cough - Acute Non-Productive-A-AH

## 2024-07-12 NOTE — Telephone Encounter (Signed)
Please relay message if patient calls back.

## 2024-07-12 NOTE — Telephone Encounter (Signed)
 Agree w/ appt to be evaluated- either a [place] office w/ availability, telehealth, or UC

## 2024-07-12 NOTE — Telephone Encounter (Signed)
 Called patient to get more details  Dr. Mahlon does not have any openings today  Patient can schedule with another provider or go to [facility]

## 2024-07-13 NOTE — Telephone Encounter (Signed)
 Called patient and he will go to [facility]

## 2024-07-18 ENCOUNTER — Ambulatory Visit (INDEPENDENT_AMBULATORY_CARE_PROVIDER_SITE_OTHER): Admitting: Family Medicine

## 2024-07-18 ENCOUNTER — Encounter: Payer: Self-pay | Admitting: Family Medicine

## 2024-07-18 VITALS — BP 121/79 | HR 80 | Wt 208.0 lb

## 2024-07-18 DIAGNOSIS — J4521 Mild intermittent asthma with (acute) exacerbation: Secondary | ICD-10-CM | POA: Diagnosis not present

## 2024-07-18 MED ORDER — CETIRIZINE HCL 10 MG PO TABS: 10.0000 mg | ORAL_TABLET | Freq: Every day | ORAL | 11 refills | Status: AC

## 2024-07-18 MED ORDER — QVAR REDIHALER 40 MCG/ACT IN AERB: 2.0000 | INHALATION_SPRAY | Freq: Two times a day (BID) | RESPIRATORY_TRACT | 1 refills | Status: AC

## 2024-07-18 MED ORDER — ALBUTEROL SULFATE HFA 108 (90 BASE) MCG/ACT IN AERS: 2.0000 | INHALATION_SPRAY | Freq: Four times a day (QID) | RESPIRATORY_TRACT | 0 refills | Status: AC | PRN

## 2024-07-18 NOTE — Progress Notes (Signed)
" ° °  Subjective:    Patient ID: Lee Greene , male    DOB: 28-Aug-1960, 64 y.o.   MRN: [id number]  HPI Cough- sxs started ~6 weeks ago.  Worse when he is warm or exposed to smoke/fumes.  Will cough to the point of feeling lightheaded.  Occasional SOB.  Some wheezing.  Denies nasal congestion.  Doesn't feel ill.  No hx of asthma.   Review of Systems For ROS see HPI     Objective:   Physical Exam Vitals reviewed.  Constitutional:      General: He is not in acute distress.    Appearance: Normal appearance. He is not ill-appearing.  HENT:     Head: Normocephalic and atraumatic.     Nose: Congestion present. No rhinorrhea.     Comments: No TTP over frontal or maxillary sinuses    Mouth/Throat:     Pharynx: No oropharyngeal exudate or posterior oropharyngeal erythema.     Comments: + PND Loud, frequent clearing of throat Eyes:     Extraocular Movements: Extraocular movements intact.     Conjunctiva/sclera: Conjunctivae normal.  Cardiovascular:     Rate and Rhythm: Normal rate and regular rhythm.     Pulses: Normal pulses.     Heart sounds: Normal heart sounds.  Pulmonary:     Effort: Pulmonary effort is normal. No respiratory distress.     Breath sounds: Normal breath sounds. No wheezing or rhonchi.  Musculoskeletal:     Cervical back: Neck supple.  Lymphadenopathy:     Cervical: No cervical adenopathy.  Skin:    General: Skin is warm and dry.  Neurological:     General: No focal deficit present.     Mental Status: He is alert and oriented to person, place, and time.  Psychiatric:        Mood and Affect: Mood normal.        Behavior: Behavior normal.        Thought Content: Thought content normal.           Assessment & Plan:  RAD- new.  Pt has never been dx'd w/ asthma but did have a difficult time w/ COVID and then had PNA a few years ago.  Other than his cough (not heard) and frequent throat clearing (which he doesn't seem to be aware of) he reports feeling  well.  Will start scheduled ICS x2 weeks and use albuterol  PRN and see if sxs improve.  Will also start daily Zyrtec  to improve PND.  Is sxs don't improve may need referral to pulmonary.  Pt expressed understanding and is in agreement w/ plan.   "

## 2024-07-18 NOTE — Patient Instructions (Addendum)
 Follow up as needed or as scheduled START the Beclomethasone (Qvar ) twice daily x2 weeks to get airway inflammation under control USE the Albuterol  inhaler- 2 puffs as needed- for cough, wheezing, shortness of breath ADD Cetirizine  (Zyrtec ) daily to improve drainage which can trigger cough Call with any questions or concerns Stay Safe!  Stay Healthy! Happy New Year!

## 2024-08-02 ENCOUNTER — Other Ambulatory Visit: Payer: Self-pay | Admitting: Family Medicine

## 2024-08-16 ENCOUNTER — Other Ambulatory Visit: Payer: Self-pay | Admitting: Family Medicine

## 2024-08-16 DIAGNOSIS — E1165 Type 2 diabetes mellitus with hyperglycemia: Secondary | ICD-10-CM

## 2024-12-12 ENCOUNTER — Encounter: Admitting: Family Medicine
